# Patient Record
Sex: Male | Born: 1955
Health system: Southern US, Community
[De-identification: ages and names within clinical notes are randomized; demographics above are authoritative.]

## PROBLEM LIST (undated history)

## (undated) DIAGNOSIS — E785 Hyperlipidemia, unspecified: Secondary | ICD-10-CM

## (undated) DIAGNOSIS — IMO0001 Reserved for inherently not codable concepts without codable children: Secondary | ICD-10-CM

## (undated) DIAGNOSIS — I10 Essential (primary) hypertension: Secondary | ICD-10-CM

## (undated) DIAGNOSIS — E119 Type 2 diabetes mellitus without complications: Secondary | ICD-10-CM

## (undated) DIAGNOSIS — K219 Gastro-esophageal reflux disease without esophagitis: Secondary | ICD-10-CM

## (undated) HISTORY — DX: Reserved for inherently not codable concepts without codable children: IMO0001

## (undated) HISTORY — PX: COLONOSCOPY: SHX174

## (undated) HISTORY — DX: Essential (primary) hypertension: I10

## (undated) HISTORY — DX: Type 2 diabetes mellitus without complications: E11.9

## (undated) HISTORY — DX: Hyperlipidemia, unspecified: E78.5

## (undated) HISTORY — DX: Gastro-esophageal reflux disease without esophagitis: K21.9

## (undated) HISTORY — PX: EYE SURGERY: SHX253

---

## 1999-04-24 ENCOUNTER — Ambulatory Visit (HOSPITAL_COMMUNITY): Admission: RE | Admit: 1999-04-24 | Discharge: 1999-04-24 | Payer: Self-pay | Admitting: Orthopedic Surgery

## 2005-06-01 ENCOUNTER — Ambulatory Visit (HOSPITAL_COMMUNITY): Admission: RE | Admit: 2005-06-01 | Discharge: 2005-06-01 | Payer: Self-pay | Admitting: Family Medicine

## 2006-06-05 ENCOUNTER — Emergency Department (HOSPITAL_COMMUNITY): Admission: EM | Admit: 2006-06-05 | Discharge: 2006-06-05 | Payer: Self-pay | Admitting: Emergency Medicine

## 2008-12-11 ENCOUNTER — Ambulatory Visit (HOSPITAL_COMMUNITY): Admission: RE | Admit: 2008-12-11 | Discharge: 2008-12-11 | Payer: Self-pay | Admitting: Family Medicine

## 2009-06-04 ENCOUNTER — Encounter: Payer: Self-pay | Admitting: Gastroenterology

## 2009-06-30 ENCOUNTER — Ambulatory Visit (HOSPITAL_COMMUNITY): Admission: RE | Admit: 2009-06-30 | Discharge: 2009-06-30 | Payer: Self-pay | Admitting: Gastroenterology

## 2009-06-30 ENCOUNTER — Ambulatory Visit: Payer: Self-pay | Admitting: Gastroenterology

## 2010-06-16 NOTE — Letter (Signed)
Summary: Internal Other/triage  Internal Other/triage   Imported By: Cloria Spring LPN 16/02/9603 54:09:81  _____________________________________________________________________  External Attachment:    Type:   Image     Comment:   External Document

## 2010-08-05 LAB — GLUCOSE, CAPILLARY: Glucose-Capillary: 110 mg/dL — ABNORMAL HIGH (ref 70–99)

## 2010-09-29 NOTE — Procedures (Signed)
Tristan Mccarty, Tristan Mccarty                ACCOUNT NO.:  [id number]   MEDICAL RECORD NO.:  [id number]          PATIENT TYPE:  OUT   LOCATION:  RAD                           FACILITY:  [facility]   PHYSICIAN:  Donna Bernard, M.D.DATE OF BIRTH:  02/17/1956   DATE OF PROCEDURE:  12/11/2008  DATE OF DISCHARGE:                                  STRESS TEST   INDICATION FOR TEST:  The patient is a 55 year old male with a strong  family history of premature coronary artery disease who is looking to  get back to regular exercise program.   Stress test was performed at standard Bruce protocol.  Resting EKG  revealed normal sinus rhythm with no significant ST-T changes.  He had  normal blood pressure, 130/80.  The patient tolerated the first 2 stages  very well.  By the third stage, he is getting somewhat short of breath.  He had no chest pain.  He had normal hypertensive response to exercise  as expected.  The patient made it all way in to the full stage 30  seconds in.  Before the test stopped on the basis of fatigue and  shortness of breath, the patient reached greater than 90%, his maximum  predicted heart rate.  His peak heart rate was 161, had maximum rate of  0.08 seconds past the J-point.  There were no significant ST-T changes  noted.   IMPRESSION:  Negative adequate stress test.   PLAN:  The patient encouraged to press on with regular exercise program.      W. Simone Curia, M.D.  Electronically Signed     WSL/MEDQ  D:  12/11/2008  T:  12/11/2008  Job:  [id number]

## 2010-10-02 NOTE — Procedures (Signed)
Tristan Mccarty, Tristan Mccarty NO.:  [id number]   MEDICAL RECORD NO.:  [id number]          PATIENT TYPE:  OUT   LOCATION:  [facility]                          FACILITY:  [facility]   PHYSICIAN:  Donna Bernard, M.D.DATE OF BIRTH:  [birth year]   DATE OF PROCEDURE:  06/01/2005  DATE OF DISCHARGE:                                    STRESS TEST   INDICATIONS:  This patient is a 55 year old white male with history of  hyperlipidemia and positive history of premature coronary artery disease in  his father along with atypical chest pain.   Stress test was performed at the standard Bruce protocol.  Resting EKG  revealed normal sinus rhythm.  No significant ST-T changes.  The patient did  have initial blood pressure 150/90.  The patient tolerated the first two  stages well.  During the third stage he experienced progressive shortness of  breath along with tachycardia.  He had no chest pain at all.  The test was  stopped at 8 minutes into the test and 2 minutes into the third stage.  At  this point, at point 0.08 seconds past the J point, there was minimal  depression in the ST segments with the sharp wave ascending slope.  At  cessation test, segments resolved quickly and nicely.   IMPRESSION:  Negative adequate stress test.   PLAN:  Regular exercise program encouraged.   DIAGNOSIS:  Working diagnosis is the chest pain is due to musculoskeletal  pain.      Donna Bernard, M.D.  Electronically Signed     WSL/MEDQ  D:  06/01/2005  T:  06/01/2005  Job:  [id number]

## 2012-10-21 ENCOUNTER — Other Ambulatory Visit: Payer: Self-pay | Admitting: Family Medicine

## 2012-10-23 ENCOUNTER — Other Ambulatory Visit: Payer: Self-pay | Admitting: Family Medicine

## 2012-10-24 ENCOUNTER — Encounter: Payer: Self-pay | Admitting: *Deleted

## 2012-11-21 ENCOUNTER — Other Ambulatory Visit: Payer: Self-pay | Admitting: Family Medicine

## 2012-11-22 ENCOUNTER — Other Ambulatory Visit: Payer: Self-pay | Admitting: Family Medicine

## 2012-12-18 ENCOUNTER — Other Ambulatory Visit: Payer: Self-pay | Admitting: Family Medicine

## 2013-01-10 ENCOUNTER — Other Ambulatory Visit: Payer: Self-pay | Admitting: Family Medicine

## 2013-03-16 ENCOUNTER — Other Ambulatory Visit: Payer: Self-pay | Admitting: Family Medicine

## 2013-04-08 ENCOUNTER — Other Ambulatory Visit: Payer: Self-pay | Admitting: Family Medicine

## 2013-04-09 ENCOUNTER — Other Ambulatory Visit: Payer: Self-pay | Admitting: Family Medicine

## 2013-04-11 ENCOUNTER — Other Ambulatory Visit: Payer: Self-pay | Admitting: Family Medicine

## 2013-04-23 ENCOUNTER — Other Ambulatory Visit: Payer: Self-pay | Admitting: Family Medicine

## 2013-05-07 ENCOUNTER — Encounter: Payer: Self-pay | Admitting: Family Medicine

## 2013-05-07 ENCOUNTER — Ambulatory Visit (INDEPENDENT_AMBULATORY_CARE_PROVIDER_SITE_OTHER): Payer: BC Managed Care – PPO | Admitting: Family Medicine

## 2013-05-07 VITALS — BP 148/96 | Ht 66.0 in | Wt 212.0 lb

## 2013-05-07 DIAGNOSIS — E119 Type 2 diabetes mellitus without complications: Secondary | ICD-10-CM

## 2013-05-07 DIAGNOSIS — Z79899 Other long term (current) drug therapy: Secondary | ICD-10-CM

## 2013-05-07 DIAGNOSIS — E785 Hyperlipidemia, unspecified: Secondary | ICD-10-CM

## 2013-05-07 DIAGNOSIS — I10 Essential (primary) hypertension: Secondary | ICD-10-CM

## 2013-05-07 LAB — POCT GLYCOSYLATED HEMOGLOBIN (HGB A1C): Hemoglobin A1C: 7.7

## 2013-05-07 MED ORDER — LOSARTAN POTASSIUM 50 MG PO TABS: ORAL_TABLET | ORAL | Status: DC

## 2013-05-07 MED ORDER — METFORMIN HCL 1000 MG PO TABS: 1000.0000 mg | ORAL_TABLET | Freq: Two times a day (BID) | ORAL | Status: DC

## 2013-05-07 MED ORDER — SIMVASTATIN 40 MG PO TABS: ORAL_TABLET | ORAL | Status: DC

## 2013-05-07 MED ORDER — PANTOPRAZOLE SODIUM 40 MG PO TBEC: 40.0000 mg | DELAYED_RELEASE_TABLET | Freq: Every day | ORAL | Status: DC

## 2013-05-07 NOTE — Progress Notes (Signed)
   Subjective:    Patient ID: Tristan Mccarty, male    DOB: [birth year], 57 y.o.   MRN: [id number]  HPI Here for a check up on meds. A1C today 7.7. Morn sugars elevated averages in the 140s. Ck each morning  Not exercising a great deal. Rare, does nt use t mill  Diet stinks, has fell off  Results for orders placed in visit on 05/07/13  POCT GLYCOSYLATED HEMOGLOBIN (HGB A1C)      Result Value Range   Hemoglobin A1C 7.7     htn meds take faithfully, rarely misses. watcheds salt intake  tykes lipid meds regularly. No obvious side effects from this. Tries to watch amount of fat in his diet. Generally does not check blood pressure elsewhere.  Patient reports reflux is stable as long as she sticks with medication.  occas bouts of diarrhea couple times per month  Saw an eye doc, visit due soon,  No concerns.     Review of Systems No headache no chest pain no abdominal pain no change in bowel habits no blood in stool ROS otherwise negative    Objective:   Physical Exam Alert no apparent distress vitals review. HEENT normal. Lungs clear. Heart regular in rhythm. C. foot exam.       Assessment & Plan:  Impression 1 hypertension decent control. #2 hyperlipidemia status uncertain. #Type 2 diabetes suboptimum control discussed with patient. Patient admits diet has not been good. Plan patient to work hard on exercise. Try to lose weight. Time of his diet. Remains compliant with medications. If A1c not improved on next visit we'll need to initiate additional medication. WSL

## 2013-05-13 DIAGNOSIS — I10 Essential (primary) hypertension: Secondary | ICD-10-CM | POA: Insufficient documentation

## 2013-05-13 DIAGNOSIS — E785 Hyperlipidemia, unspecified: Secondary | ICD-10-CM | POA: Insufficient documentation

## 2013-05-13 DIAGNOSIS — E119 Type 2 diabetes mellitus without complications: Secondary | ICD-10-CM | POA: Insufficient documentation

## 2013-05-14 ENCOUNTER — Other Ambulatory Visit: Payer: Self-pay | Admitting: Family Medicine

## 2013-05-15 LAB — HEPATIC FUNCTION PANEL
ALT: 27 U/L (ref 0–53)
Alkaline Phosphatase: 69 U/L (ref 39–117)
Bilirubin, Direct: 0.1 mg/dL (ref 0.0–0.3)
Indirect Bilirubin: 0.3 mg/dL (ref 0.0–0.9)
Total Protein: 6.8 g/dL (ref 6.0–8.3)

## 2013-05-15 LAB — LIPID PANEL
Cholesterol: 130 mg/dL (ref 0–200)
Triglycerides: 153 mg/dL — ABNORMAL HIGH (ref <150)

## 2013-05-28 ENCOUNTER — Encounter: Payer: Self-pay | Admitting: Family Medicine

## 2013-06-01 ENCOUNTER — Other Ambulatory Visit: Payer: Self-pay | Admitting: Family Medicine

## 2013-07-10 ENCOUNTER — Encounter: Payer: BC Managed Care – PPO | Admitting: Family Medicine

## 2013-07-26 ENCOUNTER — Encounter: Payer: BC Managed Care – PPO | Admitting: Family Medicine

## 2013-10-29 ENCOUNTER — Other Ambulatory Visit: Payer: Self-pay | Admitting: Family Medicine

## 2013-11-06 ENCOUNTER — Telehealth: Payer: Self-pay | Admitting: Family Medicine

## 2013-11-06 NOTE — Telephone Encounter (Signed)
Pt needs to have his Pharmacy or insurance fax [place] the  Request for a different set of strips an meter that his insurance Is requiring him to use. He states he will have them send it over Even though they have told him they have already done so on numerous  Occasions.

## 2013-11-06 NOTE — Telephone Encounter (Signed)
OK 

## 2013-11-27 ENCOUNTER — Other Ambulatory Visit: Payer: Self-pay | Admitting: Family Medicine

## 2013-11-28 ENCOUNTER — Other Ambulatory Visit: Payer: Self-pay | Admitting: Family Medicine

## 2013-12-12 ENCOUNTER — Encounter: Payer: Self-pay | Admitting: Family Medicine

## 2013-12-12 ENCOUNTER — Ambulatory Visit (INDEPENDENT_AMBULATORY_CARE_PROVIDER_SITE_OTHER): Payer: BC Managed Care – PPO | Admitting: Family Medicine

## 2013-12-12 VITALS — BP 130/86 | Ht 66.0 in | Wt 208.6 lb

## 2013-12-12 DIAGNOSIS — E119 Type 2 diabetes mellitus without complications: Secondary | ICD-10-CM

## 2013-12-12 DIAGNOSIS — Z79899 Other long term (current) drug therapy: Secondary | ICD-10-CM

## 2013-12-12 DIAGNOSIS — E782 Mixed hyperlipidemia: Secondary | ICD-10-CM

## 2013-12-12 DIAGNOSIS — Z125 Encounter for screening for malignant neoplasm of prostate: Secondary | ICD-10-CM

## 2013-12-12 DIAGNOSIS — Z0000 Encounter for general adult medical examination without abnormal findings: Secondary | ICD-10-CM

## 2013-12-12 LAB — POCT GLYCOSYLATED HEMOGLOBIN (HGB A1C): HEMOGLOBIN A1C: 7.4

## 2013-12-12 MED ORDER — LOSARTAN POTASSIUM 50 MG PO TABS: ORAL_TABLET | ORAL | Status: DC

## 2013-12-12 MED ORDER — METFORMIN HCL 1000 MG PO TABS: ORAL_TABLET | ORAL | Status: DC

## 2013-12-12 MED ORDER — PANTOPRAZOLE SODIUM 40 MG PO TBEC: 40.0000 mg | DELAYED_RELEASE_TABLET | Freq: Every day | ORAL | Status: DC

## 2013-12-12 MED ORDER — SIMVASTATIN 40 MG PO TABS: ORAL_TABLET | ORAL | Status: DC

## 2013-12-12 MED ORDER — GLIPIZIDE 5 MG PO TABS: 5.0000 mg | ORAL_TABLET | Freq: Every day | ORAL | Status: DC

## 2013-12-12 NOTE — Progress Notes (Signed)
Subjective:    Patient ID: Tristan Mccarty, male    DOB: December 16, 1955, 58 y.o.   MRN: [id number]  HPI  The patient comes in today for a wellness visit.  Neg colon ca and no prost  a  A review of their health history was completed.  A review of medications was also completed.  Any needed refills; yes  Eating habits: eating pretty good  Falls/  MVA accidents in past few months: no  Regular exercise: yard work  Sales promotion account executive pt sees on regular basis: no  Preventative health issues were discussed.   Additional concerns: sugar numbers climbing  Results for orders placed in visit on 05/07/13  LIPID PANEL      Result Value Ref Range   Cholesterol 130  0 - 200 mg/dL   Triglycerides 153 (*) <150 mg/dL   HDL 41  >39 mg/dL   Total CHOL/HDL Ratio 3.2     VLDL 31  0 - 40 mg/dL   LDL Cholesterol 58  0 - 99 mg/dL  HEPATIC FUNCTION PANEL      Result Value Ref Range   Total Bilirubin 0.4  0.3 - 1.2 mg/dL   Bilirubin, Direct 0.1  0.0 - 0.3 mg/dL   Indirect Bilirubin 0.3  0.0 - 0.9 mg/dL   Alkaline Phosphatase 69  39 - 117 U/L   AST 18  0 - 37 U/L   ALT 27  0 - 53 U/L   Total Protein 6.8  6.0 - 8.3 g/dL   Albumin 4.2  3.5 - 5.2 g/dL  MICROALBUMIN, URINE      Result Value Ref Range   Microalb, Ur 0.60  0.00 - 1.89 mg/dL  POCT GLYCOSYLATED HEMOGLOBIN (HGB A1C)      Result Value Ref Range   Hemoglobin A1C 7.7     Works 12 hr days, and uses otc antihist to help sleep  Last eye doc visit two yrs ago, to sched soon  Results for orders placed in visit on 05/07/13  LIPID PANEL      Result Value Ref Range   Cholesterol 130  0 - 200 mg/dL   Triglycerides 153 (*) <150 mg/dL   HDL 41  >39 mg/dL   Total CHOL/HDL Ratio 3.2     VLDL 31  0 - 40 mg/dL   LDL Cholesterol 58  0 - 99 mg/dL  HEPATIC FUNCTION PANEL      Result Value Ref Range   Total Bilirubin 0.4  0.3 - 1.2 mg/dL   Bilirubin, Direct 0.1  0.0 - 0.3 mg/dL   Indirect Bilirubin 0.3  0.0 - 0.9 mg/dL   Alkaline Phosphatase 69  39  - 117 U/L   AST 18  0 - 37 U/L   ALT 27  0 - 53 U/L   Total Protein 6.8  6.0 - 8.3 g/dL   Albumin 4.2  3.5 - 5.2 g/dL  MICROALBUMIN, URINE      Result Value Ref Range   Microalb, Ur 0.60  0.00 - 1.89 mg/dL  POCT GLYCOSYLATED HEMOGLOBIN (HGB A1C)      Result Value Ref Range   Hemoglobin A1C 7.7      Review of Systems  Constitutional: Negative for fever, activity change and appetite change.  HENT: Negative for congestion and rhinorrhea.   Eyes: Negative for discharge.  Respiratory: Negative for cough and wheezing.   Cardiovascular: Negative for chest pain.  Gastrointestinal: Negative for vomiting, abdominal pain and blood in stool.  Genitourinary: Negative for  frequency and difficulty urinating.  Musculoskeletal: Negative for neck pain.  Skin: Negative for rash.  Allergic/Immunologic: Negative for environmental allergies and food allergies.  Neurological: Negative for weakness and headaches.  Psychiatric/Behavioral: Negative for agitation.  All other systems reviewed and are negative.      Objective:   Physical Exam  Vitals reviewed. Constitutional: He appears well-developed and well-nourished.  HENT:  Head: Normocephalic and atraumatic.  Right Ear: External ear normal.  Left Ear: External ear normal.  Nose: Nose normal.  Mouth/Throat: Oropharynx is clear and moist.  Eyes: EOM are normal. Pupils are equal, round, and reactive to light.  Neck: Normal range of motion. Neck supple. No thyromegaly present.  Cardiovascular: Normal rate, regular rhythm and normal heart sounds.   No murmur heard. Pulmonary/Chest: Effort normal and breath sounds normal. No respiratory distress. He has no wheezes.  Abdominal: Soft. Bowel sounds are normal. He exhibits no distension and no mass. There is no tenderness.  Genitourinary: Penis normal.  Musculoskeletal: Normal range of motion. He exhibits no edema.  Lymphadenopathy:    He has no cervical adenopathy.  Neurological: He is alert. He  exhibits normal muscle tone.  Skin: Skin is warm and dry. No erythema.  Psychiatric: He has a normal mood and affect. His behavior is normal. Judgment normal.          Assessment & Plan:   #1 wellness exam. Diet discussed exercise discussed. Anticipatory guidance given. Vaccines discussed. #2 type 2 diabetes. Control worsening long discussion held. Patient willing to add new medication. Plan encouraged as noted above plus yearly eye visits. Add Glucotrol. Rationale discussed. Followup as scheduled. WSL

## 2013-12-17 ENCOUNTER — Other Ambulatory Visit: Payer: Self-pay | Admitting: Family Medicine

## 2014-01-04 ENCOUNTER — Other Ambulatory Visit: Payer: Self-pay | Admitting: Family Medicine

## 2014-01-31 ENCOUNTER — Other Ambulatory Visit: Payer: Self-pay | Admitting: Family Medicine

## 2014-02-05 ENCOUNTER — Other Ambulatory Visit: Payer: Self-pay | Admitting: *Deleted

## 2014-02-05 MED ORDER — GLIPIZIDE 5 MG PO TABS: 5.0000 mg | ORAL_TABLET | Freq: Every day | ORAL | Status: DC

## 2014-02-07 ENCOUNTER — Other Ambulatory Visit: Payer: Self-pay | Admitting: *Deleted

## 2014-02-07 MED ORDER — PANTOPRAZOLE SODIUM 40 MG PO TBEC: 40.0000 mg | DELAYED_RELEASE_TABLET | Freq: Every day | ORAL | Status: DC

## 2014-02-07 MED ORDER — LOSARTAN POTASSIUM 50 MG PO TABS: ORAL_TABLET | ORAL | Status: DC

## 2014-02-11 LAB — HEPATIC FUNCTION PANEL
ALK PHOS: 58 U/L (ref 39–117)
ALT: 26 U/L (ref 0–53)
AST: 19 U/L (ref 0–37)
Albumin: 4.5 g/dL (ref 3.5–5.2)
BILIRUBIN INDIRECT: 0.4 mg/dL (ref 0.2–1.2)
Bilirubin, Direct: 0.1 mg/dL (ref 0.0–0.3)
TOTAL PROTEIN: 6.9 g/dL (ref 6.0–8.3)
Total Bilirubin: 0.5 mg/dL (ref 0.2–1.2)

## 2014-02-11 LAB — BASIC METABOLIC PANEL
BUN: 11 mg/dL (ref 6–23)
CHLORIDE: 101 meq/L (ref 96–112)
CO2: 27 mEq/L (ref 19–32)
CREATININE: 0.84 mg/dL (ref 0.50–1.35)
Calcium: 9.5 mg/dL (ref 8.4–10.5)
Glucose, Bld: 177 mg/dL — ABNORMAL HIGH (ref 70–99)
Potassium: 4.5 mEq/L (ref 3.5–5.3)
Sodium: 138 mEq/L (ref 135–145)

## 2014-02-11 LAB — LIPID PANEL
CHOLESTEROL: 160 mg/dL (ref 0–200)
HDL: 47 mg/dL (ref >39)
LDL CALC: 76 mg/dL (ref 0–99)
TRIGLYCERIDES: 185 mg/dL — AB (ref <150)
Total CHOL/HDL Ratio: 3.4 Ratio
VLDL: 37 mg/dL (ref 0–40)

## 2014-02-12 LAB — PSA: PSA: 1.54 ng/mL (ref <=4.00)

## 2014-02-17 ENCOUNTER — Encounter: Payer: Self-pay | Admitting: Family Medicine

## 2014-06-18 ENCOUNTER — Encounter: Payer: Self-pay | Admitting: Family Medicine

## 2014-06-18 ENCOUNTER — Ambulatory Visit (INDEPENDENT_AMBULATORY_CARE_PROVIDER_SITE_OTHER): Payer: BLUE CROSS/BLUE SHIELD | Admitting: Family Medicine

## 2014-06-18 VITALS — BP 152/90 | Ht 66.0 in | Wt 213.0 lb

## 2014-06-18 DIAGNOSIS — Z79899 Other long term (current) drug therapy: Secondary | ICD-10-CM

## 2014-06-18 DIAGNOSIS — K645 Perianal venous thrombosis: Secondary | ICD-10-CM

## 2014-06-18 DIAGNOSIS — E119 Type 2 diabetes mellitus without complications: Secondary | ICD-10-CM

## 2014-06-18 DIAGNOSIS — E785 Hyperlipidemia, unspecified: Secondary | ICD-10-CM

## 2014-06-18 NOTE — Progress Notes (Signed)
   Subjective:    Patient ID: Tristan Mccarty, male    DOB: 1955/09/25, 59 y.o.   MRN: [id number]  HPI Hemorrhoids. Problems started last Thursday. Using OTC meds. Started bleeding yesterday.   This has flared up and worse, Pt applying prep h,  No recent constipation  Last colonoscpy four or five yrs ago  No pain with bm's until flare   A lot of bleeding yesterday  Very painful with sitting     Review of Systems No abdominal pain no headache no chest pain no fever    Objective:   Physical Exam Alert mild malaise. Vital stable lungs clear heart rare rhythm perirectal region impressive swollen thrombosed hemorrhoid next  Patient was prepped draped anesthetized in size large clot removed.       Assessment & Plan:  Impression thrombosed hemorrhoid plan when management discussed. Multiple questions answered. WSL

## 2014-06-30 LAB — HEPATIC FUNCTION PANEL
ALK PHOS: 62 U/L (ref 39–117)
ALT: 23 U/L (ref 0–53)
AST: 17 U/L (ref 0–37)
Albumin: 4 g/dL (ref 3.5–5.2)
BILIRUBIN DIRECT: 0.1 mg/dL (ref 0.0–0.3)
BILIRUBIN INDIRECT: 0.2 mg/dL (ref 0.2–1.2)
Total Bilirubin: 0.3 mg/dL (ref 0.2–1.2)
Total Protein: 6.8 g/dL (ref 6.0–8.3)

## 2014-06-30 LAB — LIPID PANEL
Cholesterol: 154 mg/dL (ref 0–200)
HDL: 39 mg/dL — ABNORMAL LOW (ref >39)
LDL CALC: 64 mg/dL (ref 0–99)
Total CHOL/HDL Ratio: 3.9 Ratio
Triglycerides: 254 mg/dL — ABNORMAL HIGH (ref <150)
VLDL: 51 mg/dL — ABNORMAL HIGH (ref 0–40)

## 2014-06-30 LAB — HEMOGLOBIN A1C
HEMOGLOBIN A1C: 8.7 % — AB (ref <5.7)
Mean Plasma Glucose: 203 mg/dL — ABNORMAL HIGH (ref <117)

## 2014-06-30 LAB — MICROALBUMIN, URINE: Microalb, Ur: 0.5 mg/dL (ref <2.0)

## 2014-07-02 ENCOUNTER — Other Ambulatory Visit: Payer: Self-pay | Admitting: Family Medicine

## 2014-07-10 ENCOUNTER — Other Ambulatory Visit: Payer: Self-pay | Admitting: Family Medicine

## 2014-07-22 ENCOUNTER — Ambulatory Visit: Payer: BLUE CROSS/BLUE SHIELD | Admitting: Family Medicine

## 2014-08-02 ENCOUNTER — Other Ambulatory Visit: Payer: Self-pay | Admitting: Family Medicine

## 2014-08-05 ENCOUNTER — Other Ambulatory Visit: Payer: Self-pay | Admitting: Family Medicine

## 2014-08-12 ENCOUNTER — Other Ambulatory Visit: Payer: Self-pay | Admitting: Family Medicine

## 2014-08-14 ENCOUNTER — Ambulatory Visit: Payer: BLUE CROSS/BLUE SHIELD | Admitting: Family Medicine

## 2014-08-15 ENCOUNTER — Other Ambulatory Visit: Payer: Self-pay | Admitting: Family Medicine

## 2014-08-20 ENCOUNTER — Other Ambulatory Visit: Payer: Self-pay | Admitting: Family Medicine

## 2014-08-21 ENCOUNTER — Other Ambulatory Visit: Payer: Self-pay | Admitting: Family Medicine

## 2014-08-28 ENCOUNTER — Other Ambulatory Visit: Payer: Self-pay | Admitting: *Deleted

## 2014-08-28 MED ORDER — SIMVASTATIN 40 MG PO TABS: 40.0000 mg | ORAL_TABLET | Freq: Every day | ORAL | Status: DC

## 2014-09-04 ENCOUNTER — Other Ambulatory Visit: Payer: Self-pay | Admitting: *Deleted

## 2014-09-04 MED ORDER — METFORMIN HCL 1000 MG PO TABS: 1000.0000 mg | ORAL_TABLET | Freq: Two times a day (BID) | ORAL | Status: DC

## 2014-09-07 ENCOUNTER — Other Ambulatory Visit: Payer: Self-pay | Admitting: Family Medicine

## 2014-09-09 ENCOUNTER — Ambulatory Visit (INDEPENDENT_AMBULATORY_CARE_PROVIDER_SITE_OTHER): Payer: BLUE CROSS/BLUE SHIELD | Admitting: Family Medicine

## 2014-09-09 ENCOUNTER — Encounter: Payer: Self-pay | Admitting: Family Medicine

## 2014-09-09 VITALS — BP 140/88 | Ht 66.0 in | Wt 210.8 lb

## 2014-09-09 DIAGNOSIS — I10 Essential (primary) hypertension: Secondary | ICD-10-CM

## 2014-09-09 DIAGNOSIS — E785 Hyperlipidemia, unspecified: Secondary | ICD-10-CM | POA: Diagnosis not present

## 2014-09-09 DIAGNOSIS — E119 Type 2 diabetes mellitus without complications: Secondary | ICD-10-CM

## 2014-09-09 MED ORDER — METFORMIN HCL 1000 MG PO TABS: 1000.0000 mg | ORAL_TABLET | Freq: Two times a day (BID) | ORAL | Status: DC

## 2014-09-09 MED ORDER — LOSARTAN POTASSIUM 50 MG PO TABS: 50.0000 mg | ORAL_TABLET | Freq: Every day | ORAL | Status: DC

## 2014-09-09 MED ORDER — GLIPIZIDE 5 MG PO TABS: 5.0000 mg | ORAL_TABLET | Freq: Two times a day (BID) | ORAL | Status: DC

## 2014-09-09 MED ORDER — PANTOPRAZOLE SODIUM 40 MG PO TBEC: 40.0000 mg | DELAYED_RELEASE_TABLET | Freq: Every day | ORAL | Status: DC

## 2014-09-09 MED ORDER — SIMVASTATIN 40 MG PO TABS: 40.0000 mg | ORAL_TABLET | Freq: Every day | ORAL | Status: DC

## 2014-09-09 NOTE — Progress Notes (Signed)
   Subjective:    Patient ID: Tristan Mccarty, male    DOB: 1955-11-22, 59 y.o.   MRN: [id number]  Diabetes He presents for his follow-up diabetic visit. He has type 2 diabetes mellitus. Risk factors for coronary artery disease include dyslipidemia, diabetes mellitus and hypertension. Current diabetic treatment includes oral agent (dual therapy). He is compliant with treatment all of the time. His weight is stable. He is following a diabetic diet. He has not had a previous visit with a dietitian. He does not see a podiatrist.Eye exam is current.   Discuss recent lab results from Feb.  Results for orders placed or performed in visit on 06/18/14  Lipid panel  Result Value Ref Range   Cholesterol 154 0 - 200 mg/dL   Triglycerides 254 (H) <150 mg/dL   HDL 39 (L) >39 mg/dL   Total CHOL/HDL Ratio 3.9 Ratio   VLDL 51 (H) 0 - 40 mg/dL   LDL Cholesterol 64 0 - 99 mg/dL  Hepatic function panel  Result Value Ref Range   Total Bilirubin 0.3 0.2 - 1.2 mg/dL   Bilirubin, Direct 0.1 0.0 - 0.3 mg/dL   Indirect Bilirubin 0.2 0.2 - 1.2 mg/dL   Alkaline Phosphatase 62 39 - 117 U/L   AST 17 0 - 37 U/L   ALT 23 0 - 53 U/L   Total Protein 6.8 6.0 - 8.3 g/dL   Albumin 4.0 3.5 - 5.2 g/dL  Hemoglobin A1c  Result Value Ref Range   Hgb A1c MFr Bld 8.7 (H) <5.7 %   Mean Plasma Glucose 203 (H) <117 mg/dL  Microalbumin, urine  Result Value Ref Range   Microalb, Ur 0.5 <2.0 mg/dL   200,  160s some days   Not much exercise other than walking,  Sticks with meds faithfully  eatds salad once or so per day,  Compliant with blood pressure medication. Has cut salt intake down. Does not miss a dose. Numbers generally good when checked elsewhere.  Compliant with lipid medicine. No obvious side effects. Working on fat intake.  Unfortunately not exercising.  Compliant with reflux medicine. States it definitely helps and that he needs it   Review of Systems    no headache no chest pain no back pain no  change in bowel habits no blood in stool ROS otherwise negative Objective:   Physical Exam  Alert vitals stable. HEENT normal. Lungs clear. Heart regular in rhythm. Ankles without edema.      Assessment & Plan:  Impression type 2 diabetes control suboptimal discussed #2 hypertension good control #3 hyperlipidemia LDL decent but now triglycerides higher discussed #4 reflux plan increase glipizide 2 3 times a day. Recheck in several months for wellness exam. Maintain other medications. Diet exercise discussed WSL

## 2014-09-10 ENCOUNTER — Other Ambulatory Visit: Payer: Self-pay | Admitting: Family Medicine

## 2014-09-26 ENCOUNTER — Other Ambulatory Visit: Payer: Self-pay | Admitting: Family Medicine

## 2014-11-11 LAB — HM DIABETES EYE EXAM

## 2014-12-09 ENCOUNTER — Other Ambulatory Visit: Payer: Self-pay | Admitting: Family Medicine

## 2014-12-11 ENCOUNTER — Encounter: Payer: Self-pay | Admitting: Family Medicine

## 2014-12-11 ENCOUNTER — Ambulatory Visit (INDEPENDENT_AMBULATORY_CARE_PROVIDER_SITE_OTHER): Payer: BLUE CROSS/BLUE SHIELD | Admitting: Family Medicine

## 2014-12-11 VITALS — BP 138/86 | Ht 66.0 in | Wt 213.8 lb

## 2014-12-11 DIAGNOSIS — Z125 Encounter for screening for malignant neoplasm of prostate: Secondary | ICD-10-CM | POA: Diagnosis not present

## 2014-12-11 DIAGNOSIS — E785 Hyperlipidemia, unspecified: Secondary | ICD-10-CM | POA: Diagnosis not present

## 2014-12-11 DIAGNOSIS — I10 Essential (primary) hypertension: Secondary | ICD-10-CM | POA: Diagnosis not present

## 2014-12-11 DIAGNOSIS — Z79899 Other long term (current) drug therapy: Secondary | ICD-10-CM | POA: Diagnosis not present

## 2014-12-11 DIAGNOSIS — E119 Type 2 diabetes mellitus without complications: Secondary | ICD-10-CM | POA: Diagnosis not present

## 2014-12-11 LAB — POCT GLYCOSYLATED HEMOGLOBIN (HGB A1C): HEMOGLOBIN A1C: 7.8

## 2014-12-11 MED ORDER — GLIPIZIDE 5 MG PO TABS: ORAL_TABLET | ORAL | Status: DC

## 2014-12-11 NOTE — Progress Notes (Signed)
   Subjective:    Patient ID: Tristan Mccarty, male    DOB: 1955-06-25, 59 y.o.   MRN: [id number]  Diabetes He presents for his initial diabetic visit. He has type 2 diabetes mellitus. Risk factors for coronary artery disease include diabetes mellitus, dyslipidemia and hypertension. Current diabetic treatment includes oral agent (monotherapy). He is compliant with treatment all of the time. His weight is stable. He is following a diabetic diet. He has not had a previous visit with a dietitian. He does not see a podiatrist.Eye exam is current.    Results for orders placed or performed in visit on 12/11/14  POCT glycosylated hemoglobin (Hb A1C)  Result Value Ref Range   Hemoglobin A1C 7.8    No low sugar spells  Diet overal good, not much of a difference  Traveling a fair amnt, hard time eating the right things  Patient compliant with blood pressure medicine. No obvious side effects. Blood pressure generally good when checked elsewhere. Has cut salt down.  Reflux is generally stable 1 sticking with medication.  Patient claims compliance with cholesterol medicine may be misses one out of 2 weeks dose. Otherwise handles well.    Review of Systems No headache no chest pain no back pain abdominal pain no change in bowel habits no blood in stool    Objective:   Physical Exam Alert vitals stable. H&T normal. Lungs clear. Heart regular in rhythm. Ankles without edema.       Assessment & Plan:  Impression #1 type 2 diabetes control good but not ideal discussed improved. #2 hypertension good control #3 hyperlipidemia status uncertain plan [place] exam scheduled. Diet exercise discussed. Increase glipizide to 2 3 times a day. Concerns discussed. WSL

## 2014-12-23 ENCOUNTER — Encounter: Payer: Self-pay | Admitting: Family Medicine

## 2014-12-23 LAB — BASIC METABOLIC PANEL
BUN/Creatinine Ratio: 14 (ref 9–20)
BUN: 12 mg/dL (ref 6–24)
CALCIUM: 10 mg/dL (ref 8.7–10.2)
CO2: 23 mmol/L (ref 18–29)
Chloride: 97 mmol/L (ref 97–108)
Creatinine, Ser: 0.87 mg/dL (ref 0.76–1.27)
GFR calc Af Amer: 111 mL/min/{1.73_m2} (ref >59)
GFR calc non Af Amer: 96 mL/min/{1.73_m2} (ref >59)
GLUCOSE: 189 mg/dL — AB (ref 65–99)
Potassium: 4.9 mmol/L (ref 3.5–5.2)
Sodium: 138 mmol/L (ref 134–144)

## 2014-12-23 LAB — HEPATIC FUNCTION PANEL
ALBUMIN: 4.7 g/dL (ref 3.5–5.5)
ALK PHOS: 67 IU/L (ref 39–117)
ALT: 28 IU/L (ref 0–44)
AST: 21 IU/L (ref 0–40)
Bilirubin Total: 0.4 mg/dL (ref 0.0–1.2)
Bilirubin, Direct: 0.12 mg/dL (ref 0.00–0.40)
TOTAL PROTEIN: 6.8 g/dL (ref 6.0–8.5)

## 2014-12-23 LAB — LIPID PANEL
CHOL/HDL RATIO: 3.9 ratio (ref 0.0–5.0)
Cholesterol, Total: 157 mg/dL (ref 100–199)
HDL: 40 mg/dL (ref >39)
LDL Calculated: 69 mg/dL (ref 0–99)
Triglycerides: 241 mg/dL — ABNORMAL HIGH (ref 0–149)
VLDL CHOLESTEROL CAL: 48 mg/dL — AB (ref 5–40)

## 2014-12-23 LAB — PSA: PROSTATE SPECIFIC AG, SERUM: 1.6 ng/mL (ref 0.0–4.0)

## 2014-12-23 LAB — MICROALBUMIN, URINE: MICROALBUM., U, RANDOM: 17.6 ug/mL

## 2014-12-31 ENCOUNTER — Other Ambulatory Visit: Payer: Self-pay | Admitting: Family Medicine

## 2015-01-10 ENCOUNTER — Other Ambulatory Visit: Payer: Self-pay | Admitting: Family Medicine

## 2015-03-12 ENCOUNTER — Other Ambulatory Visit: Payer: Self-pay | Admitting: Family Medicine

## 2015-03-17 ENCOUNTER — Ambulatory Visit (INDEPENDENT_AMBULATORY_CARE_PROVIDER_SITE_OTHER): Payer: BLUE CROSS/BLUE SHIELD | Admitting: Family Medicine

## 2015-03-17 ENCOUNTER — Encounter: Payer: Self-pay | Admitting: Family Medicine

## 2015-03-17 VITALS — BP 138/88 | Ht 66.5 in | Wt 209.2 lb

## 2015-03-17 DIAGNOSIS — E119 Type 2 diabetes mellitus without complications: Secondary | ICD-10-CM | POA: Diagnosis not present

## 2015-03-17 DIAGNOSIS — Z0000 Encounter for general adult medical examination without abnormal findings: Secondary | ICD-10-CM

## 2015-03-17 DIAGNOSIS — I10 Essential (primary) hypertension: Secondary | ICD-10-CM

## 2015-03-17 DIAGNOSIS — N5201 Erectile dysfunction due to arterial insufficiency: Secondary | ICD-10-CM | POA: Diagnosis not present

## 2015-03-17 LAB — POCT GLYCOSYLATED HEMOGLOBIN (HGB A1C): Hemoglobin A1C: 7.5

## 2015-03-17 MED ORDER — METFORMIN HCL 1000 MG PO TABS: 1000.0000 mg | ORAL_TABLET | Freq: Two times a day (BID) | ORAL | Status: DC

## 2015-03-17 MED ORDER — LOSARTAN POTASSIUM 50 MG PO TABS: 50.0000 mg | ORAL_TABLET | Freq: Every day | ORAL | Status: DC

## 2015-03-17 MED ORDER — PANTOPRAZOLE SODIUM 40 MG PO TBEC: 40.0000 mg | DELAYED_RELEASE_TABLET | Freq: Every day | ORAL | Status: DC

## 2015-03-17 MED ORDER — SILDENAFIL CITRATE 20 MG PO TABS: ORAL_TABLET | ORAL | Status: DC

## 2015-03-17 MED ORDER — SIMVASTATIN 40 MG PO TABS: 40.0000 mg | ORAL_TABLET | Freq: Every day | ORAL | Status: DC

## 2015-03-17 MED ORDER — GLIPIZIDE 5 MG PO TABS: 10.0000 mg | ORAL_TABLET | Freq: Two times a day (BID) | ORAL | Status: DC

## 2015-03-17 NOTE — Progress Notes (Signed)
   Subjective:    Patient ID: Tristan Mccarty, male    DOB: Oct 26, 1955, 59 y.o.   MRN: [id number]  HPI The patient comes in today for a wellness visit.    A review of their health history was completed.  A review of medications was also complete Any needed refills;  Results for orders placed or performed in visit on 03/17/15  POCT glycosylated hemoglobin (Hb A1C)  Result Value Ref Range   Hemoglobin A1C 7.5    Off and on exdcid tihhy noe   Eating habits: good  Falls/  MVA accidents in past few months: none  Regular exercise: work  Sales promotion account executive pt sees on regular basis: none  Preventative health issues were discussed.   Additional concerns: elevated sugar levels, Glu mixed lately up and down at times  Neg in 2011, next on due   No colon ca no prostate ca  no   Review of Systems  Constitutional: Negative for fever, activity change and appetite change.  HENT: Negative for congestion and rhinorrhea.   Eyes: Negative for discharge.  Respiratory: Negative for cough and wheezing.   Cardiovascular: Negative for chest pain.  Gastrointestinal: Negative for vomiting, abdominal pain and blood in stool.  Genitourinary: Negative for frequency and difficulty urinating.  Musculoskeletal: Negative for neck pain.  Skin: Negative for rash.  Allergic/Immunologic: Negative for environmental allergies and food allergies.  Neurological: Negative for weakness and headaches.  Psychiatric/Behavioral: Negative for agitation.  All other systems reviewed and are negative.      Objective:   Physical Exam  Constitutional: He appears well-developed and well-nourished.  HENT:  Head: Normocephalic and atraumatic.  Right Ear: External ear normal.  Left Ear: External ear normal.  Nose: Nose normal.  Mouth/Throat: Oropharynx is clear and moist.  Eyes: EOM are normal. Pupils are equal, round, and reactive to light.  Neck: Normal range of motion. Neck supple. No thyromegaly present.    Cardiovascular: Normal rate, regular rhythm and normal heart sounds.   No murmur heard. Pulmonary/Chest: Effort normal and breath sounds normal. No respiratory distress. He has no wheezes.  Abdominal: Soft. Bowel sounds are normal. He exhibits no distension and no mass. There is no tenderness.  Genitourinary: Penis normal.  Prostate within normal limits slight diffuse enlargement normal for age  Musculoskeletal: Normal range of motion. He exhibits no edema.  Lymphadenopathy:    He has no cervical adenopathy.  Neurological: He is alert. He exhibits normal muscle tone.  Skin: Skin is warm and dry. No erythema.  Psychiatric: He has a normal mood and affect. His behavior is normal. Judgment normal.  Vitals reviewed.         Assessment & Plan:  Impression 1 wellness exam 2 type two diab disc suboptimum control23 erectile dysfunction disc Plan trial of meds for e d, incr glucotrol diet iex disc f u as sched

## 2015-03-18 DIAGNOSIS — N529 Male erectile dysfunction, unspecified: Secondary | ICD-10-CM | POA: Insufficient documentation

## 2015-04-17 ENCOUNTER — Telehealth: Payer: Self-pay | Admitting: Family Medicine

## 2015-04-17 NOTE — Telephone Encounter (Signed)
The patient needs to understand that Sildenafil  Is not covered by his plan. There is nothing we can do to get that covered. If he tries to run this through his insurance it will be denied. If patient interested in getting it generic he caught to go to a place like Psychiatrist or  Consider getting a prescription to run through [place]. There is a flyer he can look at if he is interested once again do not bother to try to get approval of this medicine , thank you

## 2015-05-02 ENCOUNTER — Encounter: Payer: Self-pay | Admitting: Family Medicine

## 2015-05-02 NOTE — Telephone Encounter (Signed)
Mailed letter and flyer for [employer]

## 2015-06-17 ENCOUNTER — Other Ambulatory Visit: Payer: Self-pay | Admitting: Family Medicine

## 2015-09-11 ENCOUNTER — Telehealth: Payer: Self-pay | Admitting: Family Medicine

## 2015-09-11 DIAGNOSIS — Z139 Encounter for screening, unspecified: Secondary | ICD-10-CM

## 2015-09-11 DIAGNOSIS — E785 Hyperlipidemia, unspecified: Secondary | ICD-10-CM

## 2015-09-11 NOTE — Telephone Encounter (Signed)
Lip liv A1c 

## 2015-09-11 NOTE — Telephone Encounter (Signed)
Tried to call no answer. (voicemail box is full)

## 2015-09-11 NOTE — Telephone Encounter (Signed)
Pt is requesting lab orders to be sent over for an upcoming appt. Last labs per epic were: lipid,hepatic,bmp,psa and microalbumin on 12/21/14

## 2015-09-19 ENCOUNTER — Ambulatory Visit (INDEPENDENT_AMBULATORY_CARE_PROVIDER_SITE_OTHER): Payer: BLUE CROSS/BLUE SHIELD | Admitting: Family Medicine

## 2015-09-19 ENCOUNTER — Encounter: Payer: Self-pay | Admitting: Family Medicine

## 2015-09-19 ENCOUNTER — Telehealth: Payer: Self-pay | Admitting: Family Medicine

## 2015-09-19 VITALS — BP 148/98 | Ht 66.5 in | Wt 212.0 lb

## 2015-09-19 DIAGNOSIS — E785 Hyperlipidemia, unspecified: Secondary | ICD-10-CM | POA: Diagnosis not present

## 2015-09-19 DIAGNOSIS — I10 Essential (primary) hypertension: Secondary | ICD-10-CM | POA: Diagnosis not present

## 2015-09-19 DIAGNOSIS — E119 Type 2 diabetes mellitus without complications: Secondary | ICD-10-CM

## 2015-09-19 LAB — POCT GLYCOSYLATED HEMOGLOBIN (HGB A1C): Hemoglobin A1C: 8.3

## 2015-09-19 MED ORDER — METFORMIN HCL 1000 MG PO TABS: 1000.0000 mg | ORAL_TABLET | Freq: Two times a day (BID) | ORAL | Status: DC

## 2015-09-19 MED ORDER — LOSARTAN POTASSIUM 100 MG PO TABS: 100.0000 mg | ORAL_TABLET | Freq: Every day | ORAL | Status: DC

## 2015-09-19 MED ORDER — GLIPIZIDE 5 MG PO TABS: 10.0000 mg | ORAL_TABLET | Freq: Two times a day (BID) | ORAL | Status: DC

## 2015-09-19 MED ORDER — PANTOPRAZOLE SODIUM 40 MG PO TBEC: 40.0000 mg | DELAYED_RELEASE_TABLET | Freq: Every day | ORAL | Status: DC

## 2015-09-19 MED ORDER — SIMVASTATIN 40 MG PO TABS: 40.0000 mg | ORAL_TABLET | Freq: Every day | ORAL | Status: DC

## 2015-09-19 MED ORDER — CANAGLIFLOZIN 100 MG PO TABS: 100.0000 mg | ORAL_TABLET | ORAL | Status: DC

## 2015-09-19 NOTE — Telephone Encounter (Signed)
canagliflozin (INVOKANA) 100 MG TABS tablet  Pt calling stating his insurance is saying the will not pay for the drug Unless he tries one of these other meds first, Farxiga, Jardiance,  They will pay for one of these meds     [facility]

## 2015-09-19 NOTE — Progress Notes (Signed)
   Subjective:    Patient ID: Tristan Mccarty, male    DOB: March 04, 1956, 60 y.o.   MRN: [id number]  Diabetes He presents for his follow-up diabetic visit. He has type 2 diabetes mellitus. Current diabetic treatment includes oral agent (dual therapy). He is compliant with treatment all of the time. He rarely participates in exercise. His overall blood glucose range is >200 mg/dl. He does not see a podiatrist.Eye exam is current (upcoming appt next week. goes once yearly).   Results for orders placed or performed in visit on 09/19/15  POCT glycosylated hemoglobin (Hb A1C)  Result Value Ref Range   Hemoglobin A1C 8.3    Most morning numbers high 100s and sometime over 200 Pt states no other concerns today besides blood sugar.  Salt not an issue, comp reg  Diet so so   , admits to dietary noncompliance at times   Compliant with blood pressure medication. No obvious side effects. Watching salt intake.  Ongoing challenges with reflux.  Claims compliance with lipid medicine. No obvious side effects. Prior blood work reviewed today with patient.   .  Review of Systems No headache, no major weight loss or weight gain, no chest pain no back pain abdominal pain no change in bowel habits complete ROS otherwise negative     Objective:   Physical Exam   alert vital stable blood pressure in satisfactory repeat. Review the past year shows blood pressures too high for underlying diabetes vitals otherwise stable lungs clear. Heart rare rhythm H&T normal ankles without edema feet pulses sensation good intact      Assessment & Plan:   impression 1 type 2 diabetes suboptimum discuss the need for additional medication or he maxed out on current #2 hypertension suboptimal in with need increase meds rationale discussed #3 some noncompliance with diet exercise discussed #4 hyperlipidemia prior blood work reviewed. Plan increase losartan rationale discussed. Add Tristan Mccarty. Diet exercise discuss other  meds refilled recheck as scheduled. WSL

## 2015-09-22 MED ORDER — DAPAGLIFLOZIN PROPANEDIOL 5 MG PO TABS: 5.0000 mg | ORAL_TABLET | Freq: Every day | ORAL | Status: DC

## 2015-09-22 NOTE — Telephone Encounter (Signed)
Rx sent electronically to pharmacy. Patient notified and stated he will call back in one month with his sugar readings to see if the dosage need to be adjusted.

## 2015-09-22 NOTE — Telephone Encounter (Signed)
farxiga 5 mg numb 30 or 90npt pref, six mo woreth , one qam, can go to ten mg if [place], call in one mo with numbers

## 2015-09-23 ENCOUNTER — Telehealth: Payer: Self-pay | Admitting: Family Medicine

## 2015-09-23 NOTE — Telephone Encounter (Signed)
That is completely wrong that you don't take this class of meds with bp meds, stay on lower dose of losartan 50 if pt worried about it, we'll reassess bp at f u visit as scheduled, if pt would like to disc further rec o v

## 2015-09-23 NOTE — Telephone Encounter (Signed)
THIS IS LOSARTAN NOT SIMVASTATIN (pt called back )   2) pt is concerned about taking the [place] with the BP med Pharmacist told him to call here to express his concern  especially with you increasing his Bp med. The warning  Label with this med says not to take with BP meds that it Could lower is BP

## 2015-09-23 NOTE — Telephone Encounter (Signed)
Pt is calling to say that he thought the Simvastatin was supposed  To be 80 mg not the 40 he was taking due to issues with  Hypertension.  Please advise, he is still taking only the 40 until we  Call to verify with him to increase his dose. It was renewed at his appt  Last week but not for the correct dose    [facility]

## 2015-09-23 NOTE — Telephone Encounter (Signed)
Left message to return call 

## 2015-09-24 NOTE — Telephone Encounter (Signed)
Discussed with patient. Patient verbalized understanding.  Patient stated he is comfortable with the increase in [place] and will increase as directed and have his family member check blood pressure.

## 2015-09-29 ENCOUNTER — Other Ambulatory Visit: Payer: Self-pay | Admitting: *Deleted

## 2015-09-29 ENCOUNTER — Telehealth: Payer: Self-pay | Admitting: Family Medicine

## 2015-09-29 DIAGNOSIS — E785 Hyperlipidemia, unspecified: Secondary | ICD-10-CM

## 2015-09-29 DIAGNOSIS — Z79899 Other long term (current) drug therapy: Secondary | ICD-10-CM

## 2015-09-29 NOTE — Telephone Encounter (Signed)
Orders ready. Pt notified.  

## 2015-09-29 NOTE — Telephone Encounter (Signed)
Pt needs labs for [facility] instead of [facility],  Please redo and call pt when ready for pick up

## 2015-10-04 LAB — HEPATIC FUNCTION PANEL
ALT: 23 U/L (ref 9–46)
AST: 16 U/L (ref 10–35)
Albumin: 4.7 g/dL (ref 3.6–5.1)
Alkaline Phosphatase: 58 U/L (ref 40–115)
BILIRUBIN DIRECT: 0.1 mg/dL (ref <=0.2)
BILIRUBIN TOTAL: 0.4 mg/dL (ref 0.2–1.2)
Indirect Bilirubin: 0.3 mg/dL (ref 0.2–1.2)
Total Protein: 7 g/dL (ref 6.1–8.1)

## 2015-10-04 LAB — LIPID PANEL
CHOL/HDL RATIO: 3.3 ratio (ref <=5.0)
CHOLESTEROL: 135 mg/dL (ref 125–200)
HDL: 41 mg/dL (ref >=40)
LDL CALC: 56 mg/dL (ref <130)
Triglycerides: 192 mg/dL — ABNORMAL HIGH (ref <150)
VLDL: 38 mg/dL — AB (ref <30)

## 2015-10-05 ENCOUNTER — Encounter: Payer: Self-pay | Admitting: Family Medicine

## 2015-11-07 LAB — HM DIABETES EYE EXAM

## 2015-11-08 ENCOUNTER — Other Ambulatory Visit: Payer: Self-pay | Admitting: Family Medicine

## 2015-11-21 ENCOUNTER — Encounter: Payer: Self-pay | Admitting: *Deleted

## 2016-01-25 ENCOUNTER — Other Ambulatory Visit: Payer: Self-pay | Admitting: Family Medicine

## 2016-03-08 ENCOUNTER — Other Ambulatory Visit: Payer: Self-pay | Admitting: Family Medicine

## 2016-03-13 ENCOUNTER — Other Ambulatory Visit: Payer: Self-pay | Admitting: Family Medicine

## 2016-03-23 ENCOUNTER — Encounter: Payer: BLUE CROSS/BLUE SHIELD | Admitting: Family Medicine

## 2016-03-27 ENCOUNTER — Other Ambulatory Visit: Payer: Self-pay | Admitting: Family Medicine

## 2016-04-05 ENCOUNTER — Encounter: Payer: Self-pay | Admitting: Family Medicine

## 2016-04-12 ENCOUNTER — Encounter: Payer: BLUE CROSS/BLUE SHIELD | Admitting: Family Medicine

## 2016-04-15 ENCOUNTER — Encounter: Payer: Self-pay | Admitting: Family Medicine

## 2016-04-15 ENCOUNTER — Ambulatory Visit (INDEPENDENT_AMBULATORY_CARE_PROVIDER_SITE_OTHER): Payer: BLUE CROSS/BLUE SHIELD | Admitting: Family Medicine

## 2016-04-15 VITALS — BP 136/90 | Ht 66.5 in | Wt 200.4 lb

## 2016-04-15 DIAGNOSIS — E785 Hyperlipidemia, unspecified: Secondary | ICD-10-CM | POA: Diagnosis not present

## 2016-04-15 DIAGNOSIS — Z125 Encounter for screening for malignant neoplasm of prostate: Secondary | ICD-10-CM | POA: Diagnosis not present

## 2016-04-15 DIAGNOSIS — E119 Type 2 diabetes mellitus without complications: Secondary | ICD-10-CM

## 2016-04-15 DIAGNOSIS — Z79899 Other long term (current) drug therapy: Secondary | ICD-10-CM

## 2016-04-15 DIAGNOSIS — I10 Essential (primary) hypertension: Secondary | ICD-10-CM

## 2016-04-15 LAB — POCT GLYCOSYLATED HEMOGLOBIN (HGB A1C): Hemoglobin A1C: 6.3

## 2016-04-15 MED ORDER — LOSARTAN POTASSIUM 50 MG PO TABS: 50.0000 mg | ORAL_TABLET | Freq: Every day | ORAL | 1 refills | Status: DC

## 2016-04-15 MED ORDER — DAPAGLIFLOZIN PROPANEDIOL 5 MG PO TABS: 5.0000 mg | ORAL_TABLET | Freq: Every day | ORAL | 1 refills | Status: DC

## 2016-04-15 MED ORDER — GLIPIZIDE 5 MG PO TABS: 10.0000 mg | ORAL_TABLET | Freq: Two times a day (BID) | ORAL | 1 refills | Status: DC

## 2016-04-15 MED ORDER — PANTOPRAZOLE SODIUM 40 MG PO TBEC: 40.0000 mg | DELAYED_RELEASE_TABLET | Freq: Every day | ORAL | 1 refills | Status: DC

## 2016-04-15 MED ORDER — SIMVASTATIN 40 MG PO TABS: 40.0000 mg | ORAL_TABLET | Freq: Every day | ORAL | 1 refills | Status: DC

## 2016-04-15 MED ORDER — METFORMIN HCL 1000 MG PO TABS: 1000.0000 mg | ORAL_TABLET | Freq: Two times a day (BID) | ORAL | 1 refills | Status: DC

## 2016-04-15 NOTE — Progress Notes (Signed)
   Subjective:    Patient ID: Tristan Mccarty, male    DOB: September 13, 1955, 60 y.o.   MRN: [id number]  Diabetes  He presents for his follow-up diabetic visit. He has type 2 diabetes mellitus. There are no hypoglycemic associated symptoms. There are no diabetic associated symptoms. There are no hypoglycemic complications. There are no diabetic complications. There are no known risk factors for coronary artery disease. Current diabetic treatment includes oral agent (triple therapy). He is compliant with treatment all of the time.   Results for orders placed or performed in visit on 04/15/16  POCT glycosylated hemoglobin (Hb A1C)  Result Value Ref Range   Hemoglobin A1C 6.3    Working on diet and exr  And has lost fourten pounds.   Patient states that his legs swell and the bottom of his feet are very dry. He wants to know if compression stockings will help with this.   Patient received a tick bite back in June and he wants the doctor to take a look at it.   Blood pressure medicine and blood pressure levels reviewed today with patient. Compliant with blood pressure medicine. States does not miss a dose. No obvious side effects. Blood pressure generally good when checked elsewhere. Watching salt intake.  Patient continues to take lipid medication regularly. No obvious side effects from it. Generally does not miss a dose. Prior blood work results are reviewed with patient. Patient continues to work on fat intake in diet  Patient claims compliance with diabetes medication. No obvious side effects. Reports no substantial low sugar spells. Most numbers are generally in good range when checked fasting. Generally does not miss a dose of medication. Watching diabetic diet closely  Has had a couple low sugar spells.  Already had flu shot       Review of Systems No headache, no major weight loss or weight gain, no chest pain no back pain abdominal pain no change in bowel habits complete ROS  otherwise negative     Objective:   Physical Exam  Alert vitals stable, NAD. Blood pressure good on repeat. HEENT normal. Lungs clear. Heart regular rate and rhythm. Diabetic foot exam pulses intact sensation good trace edema      Assessment & Plan:  Impression 1 type 2 diabetes control much improved discussed maintain same #2 hyperlipidemia controlled good prior blood work reviewed discussed #3 hypertension good control discussed maintain same plan diet exercise discussed. Medications refilled. Blood work ordered. Multiple questions answered

## 2016-05-09 ENCOUNTER — Other Ambulatory Visit: Payer: Self-pay | Admitting: Family Medicine

## 2016-05-14 LAB — BASIC METABOLIC PANEL
BUN/Creatinine Ratio: 16 (ref 9–20)
BUN: 13 mg/dL (ref 6–24)
CO2: 21 mmol/L (ref 18–29)
Calcium: 9.4 mg/dL (ref 8.7–10.2)
Chloride: 102 mmol/L (ref 96–106)
Creatinine, Ser: 0.8 mg/dL (ref 0.76–1.27)
GFR, EST AFRICAN AMERICAN: 114 mL/min/{1.73_m2} (ref >59)
GFR, EST NON AFRICAN AMERICAN: 98 mL/min/{1.73_m2} (ref >59)
Glucose: 151 mg/dL — ABNORMAL HIGH (ref 65–99)
POTASSIUM: 4.8 mmol/L (ref 3.5–5.2)
SODIUM: 141 mmol/L (ref 134–144)

## 2016-05-14 LAB — HEPATIC FUNCTION PANEL
ALBUMIN: 4.5 g/dL (ref 3.5–5.5)
ALK PHOS: 71 IU/L (ref 39–117)
ALT: 18 IU/L (ref 0–44)
AST: 12 IU/L (ref 0–40)
Bilirubin Total: 0.3 mg/dL (ref 0.0–1.2)
Bilirubin, Direct: 0.1 mg/dL (ref 0.00–0.40)
Total Protein: 7 g/dL (ref 6.0–8.5)

## 2016-05-14 LAB — LIPID PANEL
CHOLESTEROL TOTAL: 143 mg/dL (ref 100–199)
Chol/HDL Ratio: 3.3 ratio units (ref 0.0–5.0)
HDL: 43 mg/dL (ref >39)
LDL Calculated: 68 mg/dL (ref 0–99)
TRIGLYCERIDES: 161 mg/dL — AB (ref 0–149)
VLDL CHOLESTEROL CAL: 32 mg/dL (ref 5–40)

## 2016-05-14 LAB — MICROALBUMIN / CREATININE URINE RATIO
CREATININE, UR: 180.9 mg/dL
Microalb/Creat Ratio: 8.1 mg/g creat (ref 0.0–30.0)
Microalbumin, Urine: 14.7 ug/mL

## 2016-05-14 LAB — PSA: Prostate Specific Ag, Serum: 2.4 ng/mL (ref 0.0–4.0)

## 2016-05-17 ENCOUNTER — Encounter: Payer: Self-pay | Admitting: Family Medicine

## 2016-05-19 ENCOUNTER — Other Ambulatory Visit: Payer: Self-pay | Admitting: *Deleted

## 2016-05-19 MED ORDER — GLUCOSE BLOOD VI STRP: ORAL_STRIP | 1 refills | Status: DC

## 2016-05-20 ENCOUNTER — Other Ambulatory Visit: Payer: Self-pay | Admitting: *Deleted

## 2016-05-20 MED ORDER — GLUCOSE BLOOD VI STRP: ORAL_STRIP | 5 refills | Status: DC

## 2016-06-18 ENCOUNTER — Ambulatory Visit (INDEPENDENT_AMBULATORY_CARE_PROVIDER_SITE_OTHER): Payer: Managed Care, Other (non HMO) | Admitting: Family Medicine

## 2016-06-18 ENCOUNTER — Encounter: Payer: Self-pay | Admitting: Family Medicine

## 2016-06-18 VITALS — BP 134/86 | Ht 66.25 in | Wt 200.6 lb

## 2016-06-18 DIAGNOSIS — Z0000 Encounter for general adult medical examination without abnormal findings: Secondary | ICD-10-CM | POA: Diagnosis not present

## 2016-06-18 NOTE — Patient Instructions (Signed)
Results for orders placed or performed in visit on 04/15/16  Lipid panel  Result Value Ref Range   Cholesterol, Total 143 100 - 199 mg/dL   Triglycerides 161 (H) 0 - 149 mg/dL   HDL 43 >39 mg/dL   VLDL Cholesterol Cal 32 5 - 40 mg/dL   LDL Calculated 68 0 - 99 mg/dL   Chol/HDL Ratio 3.3 0.0 - 5.0 ratio units  Hepatic function panel  Result Value Ref Range   Total Protein 7.0 6.0 - 8.5 g/dL   Albumin 4.5 3.5 - 5.5 g/dL   Bilirubin Total 0.3 0.0 - 1.2 mg/dL   Bilirubin, Direct 0.10 0.00 - 0.40 mg/dL   Alkaline Phosphatase 71 39 - 117 IU/L   AST 12 0 - 40 IU/L   ALT 18 0 - 44 IU/L  Basic metabolic panel  Result Value Ref Range   Glucose 151 (H) 65 - 99 mg/dL   BUN 13 6 - 24 mg/dL   Creatinine, Ser 0.80 0.76 - 1.27 mg/dL   GFR calc non Af Amer 98 >59 mL/min/1.73   GFR calc Af Amer 114 >59 mL/min/1.73   BUN/Creatinine Ratio 16 9 - 20   Sodium 141 134 - 144 mmol/L   Potassium 4.8 3.5 - 5.2 mmol/L   Chloride 102 96 - 106 mmol/L   CO2 21 18 - 29 mmol/L   Calcium 9.4 8.7 - 10.2 mg/dL  PSA  Result Value Ref Range   Prostate Specific Ag, Serum 2.4 0.0 - 4.0 ng/mL  Microalbumin / creatinine urine ratio  Result Value Ref Range   Creatinine, Urine 180.9 Not Estab. mg/dL   Albumin, Urine 14.7 Not Estab. ug/mL   Microalb/Creat Ratio 8.1 0.0 - 30.0 mg/g creat  POCT glycosylated hemoglobin (Hb A1C)  Result Value Ref Range   Hemoglobin A1C 6.3

## 2016-06-18 NOTE — Progress Notes (Signed)
   Subjective:    Patient ID: Tristan Mccarty, male    DOB: [birth year], 61 y.o.   MRN: [id number]  HPIpt arrives today to have biometric screening done. Pt brought in form that needs to be filled out. Pt states no concerns today.   eercise not very well, not very active at all  Always wide open  Flu shot already given   Colonoscopy not done yet   No hx of colon canc er or psrotate cancer  sg                                   hingles vaccine given already    Tet shot four yrs ago  gtries to watch diet, dificult with salds and such, watching iet with diab hx  Review of Systems  Constitutional: Negative for activity change, appetite change and fever.  HENT: Negative for congestion and rhinorrhea.   Eyes: Negative for discharge.  Respiratory: Negative for cough and wheezing.   Cardiovascular: Negative for chest pain.  Gastrointestinal: Negative for abdominal pain, blood in stool and vomiting.  Genitourinary: Negative for difficulty urinating and frequency.  Musculoskeletal: Negative for neck pain.  Skin: Negative for rash.  Allergic/Immunologic: Negative for environmental allergies and food allergies.  Neurological: Negative for weakness and headaches.  Psychiatric/Behavioral: Negative for agitation.  All other systems reviewed and are negative.      Objective:   Physical Exam  Constitutional: He appears well-developed and well-nourished.  HENT:  Head: Normocephalic and atraumatic.  Right Ear: External ear normal.  Left Ear: External ear normal.  Nose: Nose normal.  Mouth/Throat: Oropharynx is clear and moist.  Eyes: EOM are normal. Pupils are equal, round, and reactive to light.  Neck: Normal range of motion. Neck supple. No thyromegaly present.  Cardiovascular: Normal rate, regular rhythm and normal heart sounds.   No murmur heard. Pulmonary/Chest: Effort normal and breath sounds normal. No respiratory distress. He has no wheezes.    Abdominal: Soft. Bowel sounds are normal. He exhibits no distension and no mass. There is no tenderness.  Genitourinary: Penis normal.  Genitourinary Comments: Prostate exam within normal limits  Musculoskeletal: Normal range of motion. He exhibits no edema.  Lymphadenopathy:    He has no cervical adenopathy.  Neurological: He is alert. He exhibits normal muscle tone.  Skin: Skin is warm and dry. No erythema.  Psychiatric: He has a normal mood and affect. His behavior is normal. Judgment normal.  Vitals reviewed.         Assessment & Plan:  Impression wellness exam plan biometric screening filled out. Diet exercise discussed. Up-to-date on colonoscopy. Up-to-date on vaccinations. Forms submitted follow-up for chronic as scheduled in about 4 months(

## 2016-08-02 ENCOUNTER — Telehealth: Payer: Self-pay | Admitting: Family Medicine

## 2016-08-02 MED ORDER — BENZONATATE 100 MG PO CAPS: 100.0000 mg | ORAL_CAPSULE | Freq: Three times a day (TID) | ORAL | 1 refills | Status: DC | PRN

## 2016-08-02 NOTE — Telephone Encounter (Signed)
CALL IN THE PRESCRIPTION TO [facility] [address] [place] [phone]

## 2016-08-02 NOTE — Telephone Encounter (Signed)
Prescription sent electronically to pharmacy. Patient notified. 

## 2016-08-02 NOTE — Telephone Encounter (Signed)
Tess perles 100 mg 36 one tid prn cough, one ref

## 2016-08-02 NOTE — Telephone Encounter (Incomplete)
Spoke with patient and patient stated that he has a dry cough. No SOB. 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 States he has tried cough drops and they do help some. States we can call him tomorrow and he will let us know which pharmacy we can go to. Please advise?

## 2016-08-02 NOTE — Telephone Encounter (Signed)
Patient has a cough and is requesting Rx to help his cough.  He is hoping this can be done by 10 am this morning because he has to leave to catch a flight.  [facility]

## 2016-10-07 ENCOUNTER — Ambulatory Visit (INDEPENDENT_AMBULATORY_CARE_PROVIDER_SITE_OTHER): Payer: Managed Care, Other (non HMO) | Admitting: Family Medicine

## 2016-10-07 ENCOUNTER — Ambulatory Visit: Payer: Managed Care, Other (non HMO) | Admitting: Family Medicine

## 2016-10-07 ENCOUNTER — Encounter: Payer: Self-pay | Admitting: Family Medicine

## 2016-10-07 VITALS — BP 120/80 | Temp 98.5°F | Ht 66.25 in | Wt 201.1 lb

## 2016-10-07 DIAGNOSIS — J329 Chronic sinusitis, unspecified: Secondary | ICD-10-CM

## 2016-10-07 DIAGNOSIS — H6502 Acute serous otitis media, left ear: Secondary | ICD-10-CM

## 2016-10-07 DIAGNOSIS — J310 Chronic rhinitis: Secondary | ICD-10-CM

## 2016-10-07 LAB — HM DIABETES EYE EXAM

## 2016-10-07 MED ORDER — AMOXICILLIN-POT CLAVULANATE 875-125 MG PO TABS: ORAL_TABLET | ORAL | 0 refills | Status: DC

## 2016-10-07 MED ORDER — BENZONATATE 100 MG PO CAPS: 100.0000 mg | ORAL_CAPSULE | Freq: Three times a day (TID) | ORAL | 1 refills | Status: DC | PRN

## 2016-10-07 MED ORDER — KETOCONAZOLE 2 % EX CREA: 1.0000 "application " | TOPICAL_CREAM | Freq: Two times a day (BID) | CUTANEOUS | 2 refills | Status: DC

## 2016-10-07 NOTE — Progress Notes (Signed)
   Subjective:    Patient ID: Tristan Mccarty, male    DOB: 06-Feb-1956, 61 y.o.   MRN: [id number]  Cough  This is a new problem. The current episode started 1 to 4 weeks ago. The cough is non-productive.   [place] since first of march, developed cough, tyook med went away, ten cam ack last wk  Sinus symtom and cong and cough, cough violent at times  Lost voice  Sugar elev due to bad cough  Frontal sins pressure nd disofort    Patient also has concerns of blood blisters to right foot.  Review of Systems  Respiratory: Positive for cough.        Objective:   Physical Exam  Alert, mild malaise. Hydration good Vitals stable. frontal/ maxillary tenderness evident positive nasal congestion. pharynx normal neck supple  lungs clear/no crackles or wheezes. heart regular in rhythm       Assessment & Plan:  Impression rhinosinusitis likely post viral, discussed with patient. plan antibiotics prescribed. Questions answered. Symptomatic care discussed. warning signs discussed. Nord with left otitis media and bronchitis. Symptom care discussed

## 2016-10-15 ENCOUNTER — Ambulatory Visit: Payer: Managed Care, Other (non HMO) | Admitting: Family Medicine

## 2016-10-19 ENCOUNTER — Encounter: Payer: Self-pay | Admitting: *Deleted

## 2016-10-22 ENCOUNTER — Ambulatory Visit: Payer: Managed Care, Other (non HMO) | Admitting: Family Medicine

## 2016-11-02 ENCOUNTER — Encounter: Payer: Self-pay | Admitting: Family Medicine

## 2016-11-21 ENCOUNTER — Other Ambulatory Visit: Payer: Self-pay | Admitting: Family Medicine

## 2016-12-05 ENCOUNTER — Other Ambulatory Visit: Payer: Self-pay | Admitting: Family Medicine

## 2016-12-18 ENCOUNTER — Other Ambulatory Visit: Payer: Self-pay | Admitting: Family Medicine

## 2017-01-09 ENCOUNTER — Other Ambulatory Visit: Payer: Self-pay | Admitting: Family Medicine

## 2017-01-10 ENCOUNTER — Other Ambulatory Visit: Payer: Self-pay | Admitting: Family Medicine

## 2017-01-20 ENCOUNTER — Other Ambulatory Visit: Payer: Self-pay | Admitting: Family Medicine

## 2017-01-21 ENCOUNTER — Encounter: Payer: Self-pay | Admitting: Family Medicine

## 2017-01-21 ENCOUNTER — Ambulatory Visit (INDEPENDENT_AMBULATORY_CARE_PROVIDER_SITE_OTHER): Payer: Managed Care, Other (non HMO) | Admitting: Family Medicine

## 2017-01-21 VITALS — BP 160/90 | Temp 98.0°F | Ht 66.25 in | Wt 202.0 lb

## 2017-01-21 DIAGNOSIS — I10 Essential (primary) hypertension: Secondary | ICD-10-CM

## 2017-01-21 DIAGNOSIS — B353 Tinea pedis: Secondary | ICD-10-CM

## 2017-01-21 DIAGNOSIS — E785 Hyperlipidemia, unspecified: Secondary | ICD-10-CM

## 2017-01-21 DIAGNOSIS — Z79899 Other long term (current) drug therapy: Secondary | ICD-10-CM

## 2017-01-21 DIAGNOSIS — E119 Type 2 diabetes mellitus without complications: Secondary | ICD-10-CM | POA: Diagnosis not present

## 2017-01-21 MED ORDER — DAPAGLIFLOZIN PROPANEDIOL 5 MG PO TABS: 5.0000 mg | ORAL_TABLET | Freq: Every day | ORAL | 1 refills | Status: DC

## 2017-01-21 MED ORDER — LOSARTAN POTASSIUM 50 MG PO TABS
50.0000 mg | ORAL_TABLET | Freq: Every day | ORAL | 1 refills | Status: DC
Start: 2017-01-21 — End: 2017-04-04

## 2017-01-21 MED ORDER — SIMVASTATIN 40 MG PO TABS: 40.0000 mg | ORAL_TABLET | Freq: Every day | ORAL | 1 refills | Status: DC

## 2017-01-21 MED ORDER — GLIPIZIDE 5 MG PO TABS: 10.0000 mg | ORAL_TABLET | Freq: Two times a day (BID) | ORAL | 1 refills | Status: DC

## 2017-01-21 MED ORDER — METFORMIN HCL 1000 MG PO TABS: 1000.0000 mg | ORAL_TABLET | Freq: Two times a day (BID) | ORAL | 1 refills | Status: DC

## 2017-01-21 MED ORDER — PANTOPRAZOLE SODIUM 40 MG PO TBEC: 40.0000 mg | DELAYED_RELEASE_TABLET | Freq: Every day | ORAL | 1 refills | Status: DC

## 2017-01-21 NOTE — Progress Notes (Addendum)
   Subjective:    Patient ID: Tristan Mccarty, male    DOB: July 05, 1955, 61 y.o.   MRN: [id number]  HPI Patient here with Right foot sores, states is uncomfortable. Has had since January 2018. He puts a diabetic lotion on it,seems to help.Has changed socks.Not healing. He's been present for several months. Also has diabetes. Also needs to be followed up with his primary care doctor Dr. Richardson Landry Review of Systems  Constitutional: Negative for activity change, fatigue and fever.  Respiratory: Negative for cough and shortness of breath.   Cardiovascular: Negative for chest pain and leg swelling.  Gastrointestinal: Negative for abdominal pain and vomiting.  Neurological: Negative for weakness and headaches.  Psychiatric/Behavioral: Negative for confusion.  lb     Objective:   Physical Exam  Constitutional: He appears well-nourished. No distress.  Cardiovascular: Normal rate, regular rhythm and normal heart sounds.   No murmur heard. Pulmonary/Chest: Effort normal and breath sounds normal. No respiratory distress.  Musculoskeletal: He exhibits no edema.  Lymphadenopathy:    He has no cervical adenopathy.  Neurological: He is alert.  Psychiatric: His behavior is normal.  Vitals reviewed.   Lungs clear heart regular elevated blood pressure noted right foot has scaling areas consistent with tinea      Assessment & Plan:  Fungal culture Use Lamisil cream If not doing better over the next 3 weeks notify us May need oral new medication Patient to do lab work and follow-up in November for comprehensive checkup Blood pressure mildly elevated but patient had not been on medication recently

## 2017-01-22 ENCOUNTER — Other Ambulatory Visit: Payer: Self-pay | Admitting: Family Medicine

## 2017-02-15 ENCOUNTER — Encounter: Payer: Self-pay | Admitting: Family Medicine

## 2017-02-22 LAB — FUNGUS CULTURE W SMEAR

## 2017-03-16 ENCOUNTER — Other Ambulatory Visit: Payer: Self-pay | Admitting: Family Medicine

## 2017-03-20 ENCOUNTER — Other Ambulatory Visit: Payer: Self-pay | Admitting: Family Medicine

## 2017-03-31 LAB — BASIC METABOLIC PANEL
BUN / CREAT RATIO: 20 (ref 9–20)
BUN: 16 mg/dL (ref 6–24)
CHLORIDE: 104 mmol/L (ref 96–106)
CO2: 20 mmol/L (ref 20–29)
Calcium: 9.3 mg/dL (ref 8.7–10.2)
Creatinine, Ser: 0.79 mg/dL (ref 0.76–1.27)
GFR calc non Af Amer: 98 mL/min/{1.73_m2} (ref >59)
GFR, EST AFRICAN AMERICAN: 114 mL/min/{1.73_m2} (ref >59)
Glucose: 154 mg/dL — ABNORMAL HIGH (ref 65–99)
POTASSIUM: 4.6 mmol/L (ref 3.5–5.2)
Sodium: 143 mmol/L (ref 134–144)

## 2017-03-31 LAB — LIPID PANEL
CHOLESTEROL TOTAL: 167 mg/dL (ref 100–199)
Chol/HDL Ratio: 4.6 ratio (ref 0.0–5.0)
HDL: 36 mg/dL — AB (ref >39)
TRIGLYCERIDES: 446 mg/dL — AB (ref 0–149)

## 2017-03-31 LAB — HEPATIC FUNCTION PANEL
ALT: 19 IU/L (ref 0–44)
AST: 11 IU/L (ref 0–40)
Albumin: 4.5 g/dL (ref 3.5–5.5)
Alkaline Phosphatase: 66 IU/L (ref 39–117)
BILIRUBIN TOTAL: 0.3 mg/dL (ref 0.0–1.2)
Bilirubin, Direct: 0.09 mg/dL (ref 0.00–0.40)
Total Protein: 6.8 g/dL (ref 6.0–8.5)

## 2017-03-31 LAB — HEMOGLOBIN A1C
ESTIMATED AVERAGE GLUCOSE: 189 mg/dL
HEMOGLOBIN A1C: 8.2 % — AB (ref 4.8–5.6)

## 2017-04-04 ENCOUNTER — Encounter: Payer: Self-pay | Admitting: Family Medicine

## 2017-04-04 ENCOUNTER — Ambulatory Visit (INDEPENDENT_AMBULATORY_CARE_PROVIDER_SITE_OTHER): Payer: Managed Care, Other (non HMO) | Admitting: Family Medicine

## 2017-04-04 VITALS — BP 142/86 | Ht 66.25 in | Wt 207.0 lb

## 2017-04-04 DIAGNOSIS — E119 Type 2 diabetes mellitus without complications: Secondary | ICD-10-CM | POA: Diagnosis not present

## 2017-04-04 DIAGNOSIS — E785 Hyperlipidemia, unspecified: Secondary | ICD-10-CM

## 2017-04-04 DIAGNOSIS — I10 Essential (primary) hypertension: Secondary | ICD-10-CM | POA: Diagnosis not present

## 2017-04-04 MED ORDER — LOSARTAN POTASSIUM 50 MG PO TABS: 50.0000 mg | ORAL_TABLET | Freq: Every day | ORAL | 1 refills | Status: DC

## 2017-04-04 MED ORDER — SIMVASTATIN 40 MG PO TABS: 40.0000 mg | ORAL_TABLET | Freq: Every day | ORAL | 1 refills | Status: DC

## 2017-04-04 MED ORDER — PANTOPRAZOLE SODIUM 40 MG PO TBEC: 40.0000 mg | DELAYED_RELEASE_TABLET | Freq: Every day | ORAL | 1 refills | Status: DC

## 2017-04-04 MED ORDER — METFORMIN HCL 1000 MG PO TABS: 1000.0000 mg | ORAL_TABLET | Freq: Two times a day (BID) | ORAL | 1 refills | Status: DC

## 2017-04-04 MED ORDER — GLIPIZIDE 5 MG PO TABS: 10.0000 mg | ORAL_TABLET | Freq: Two times a day (BID) | ORAL | 1 refills | Status: DC

## 2017-04-04 MED ORDER — DAPAGLIFLOZIN PROPANEDIOL 10 MG PO TABS: 10.0000 mg | ORAL_TABLET | Freq: Every day | ORAL | 0 refills | Status: DC

## 2017-04-04 NOTE — Progress Notes (Signed)
Subjective:    Patient ID: Tristan Mccarty, male    DOB: June 24, 1955, 61 y.o.   MRN: [id number] Patient presents for numerous concerns Diabetes  He presents for his follow-up diabetic visit. He has type 2 diabetes mellitus. He is compliant with treatment all of the time. Home blood sugar record trend: blood sugar has been running high. 170's - 190's. He does not see a podiatrist.Eye exam is current (may 2018).   A1C done on bloodwork on 03/30/17. 8.2.  Results for orders placed or performed in visit on 01/21/17  Fungus Culture & Smear  Result Value Ref Range   Fungus Stain Final report    Result 1 Comment    Fungus (Mycology) Culture Final report    Fungal result 1 Trichophyton rubrum   Lipid panel  Result Value Ref Range   Cholesterol, Total 167 100 - 199 mg/dL   Triglycerides 446 (H) 0 - 149 mg/dL   HDL 36 (L) >39 mg/dL   VLDL Cholesterol Cal Comment 5 - 40 mg/dL   LDL Calculated Comment 0 - 99 mg/dL   Chol/HDL Ratio 4.6 0.0 - 5.0 ratio  Hepatic function panel  Result Value Ref Range   Total Protein 6.8 6.0 - 8.5 g/dL   Albumin 4.5 3.5 - 5.5 g/dL   Bilirubin Total 0.3 0.0 - 1.2 mg/dL   Bilirubin, Direct 0.09 0.00 - 0.40 mg/dL   Alkaline Phosphatase 66 39 - 117 IU/L   AST 11 0 - 40 IU/L   ALT 19 0 - 44 IU/L  Hemoglobin A1c  Result Value Ref Range   Hgb A1c MFr Bld 8.2 (H) 4.8 - 5.6 %   Est. average glucose Bld gHb Est-mCnc 189 mg/dL  Basic metabolic panel  Result Value Ref Range   Glucose 154 (H) 65 - 99 mg/dL   BUN 16 6 - 24 mg/dL   Creatinine, Ser 0.79 0.76 - 1.27 mg/dL   GFR calc non Af Amer 98 >59 mL/min/1.73   GFR calc Af Amer 114 >59 mL/min/1.73   BUN/Creatinine Ratio 20 9 - 20   Sodium 143 134 - 144 mmol/L   Potassium 4.6 3.5 - 5.2 mmol/L   Chloride 104 96 - 106 mmol/L   CO2 20 20 - 29 mmol/L   Calcium 9.3 8.7 - 10.2 mg/dL    Patient claims compliance with diabetes medication. No obvious side effects. Reports no substantial low sugar spells. Most numbers  are generally in good range when checked fasting. Generally does not miss a dose of medication. Watching diabetic diet closely  Blood pressure medicine and blood pressure levels reviewed today with patient. Compliant with blood pressure medicine. States does not miss a dose. No obvious side effects. Blood pressure generally good when checked elsewhere. Watching salt intake.  Patient continues to take lipid medication regularly. No obvious side effects from it. Generally does not miss a dose. Prior blood work results are reviewed with patient. Patient continues to work on fat intake in diet   Concerns about taking farxiga.    Review of Systems No headache, no major weight loss or weight gain, no chest pain no back pain abdominal pain no change in bowel habits complete ROS otherwise negative     Objective:   Physical Exam Alert and oriented, vitals reviewed and stable, NAD ENT-TM's and ext canals WNL bilat via otoscopic exam Soft palate, tonsils and post pharynx WNL via oropharyngeal exam Neck-symmetric, no masses; thyroid nonpalpable and nontender Pulmonary-no tachypnea or accessory muscle use;  Clear without wheezes via auscultation Card--no abnrml murmurs, rhythm reg and rate WNL Carotid pulses symmetric, without bruits        Assessment & Plan:  Impression 1 hypertension.  Good control discussed.  Maintain medications  2.  Hyperlipidemia.  Prior blood work discussed.  Months discussed to maintain same meds  3.  Type 2 diabetes suboptimal control.  Plus minus diet and exercise likely.  Long discussion held regarding Arciga.  Low risk occasions complications.  Increase dose to 10 mg.  Rationale discussed  Greater than 50% of this 25 minute face to face visit was spent in counseling and discussion and coordination of care regarding the above diagnosis/diagnosies

## 2017-07-09 ENCOUNTER — Other Ambulatory Visit: Payer: Self-pay | Admitting: Family Medicine

## 2017-10-03 ENCOUNTER — Ambulatory Visit (INDEPENDENT_AMBULATORY_CARE_PROVIDER_SITE_OTHER): Payer: Managed Care, Other (non HMO) | Admitting: Family Medicine

## 2017-10-03 ENCOUNTER — Encounter: Payer: Self-pay | Admitting: Family Medicine

## 2017-10-03 VITALS — BP 148/86 | Ht 66.0 in | Wt 207.4 lb

## 2017-10-03 DIAGNOSIS — Z125 Encounter for screening for malignant neoplasm of prostate: Secondary | ICD-10-CM | POA: Diagnosis not present

## 2017-10-03 DIAGNOSIS — I10 Essential (primary) hypertension: Secondary | ICD-10-CM

## 2017-10-03 DIAGNOSIS — Z0001 Encounter for general adult medical examination with abnormal findings: Secondary | ICD-10-CM | POA: Diagnosis not present

## 2017-10-03 DIAGNOSIS — Z0000 Encounter for general adult medical examination without abnormal findings: Secondary | ICD-10-CM

## 2017-10-03 DIAGNOSIS — E785 Hyperlipidemia, unspecified: Secondary | ICD-10-CM

## 2017-10-03 DIAGNOSIS — E119 Type 2 diabetes mellitus without complications: Secondary | ICD-10-CM

## 2017-10-03 LAB — POCT GLYCOSYLATED HEMOGLOBIN (HGB A1C): Hemoglobin A1C: 7.5

## 2017-10-03 MED ORDER — PANTOPRAZOLE SODIUM 40 MG PO TBEC: 40.0000 mg | DELAYED_RELEASE_TABLET | Freq: Every day | ORAL | 1 refills | Status: DC

## 2017-10-03 MED ORDER — SIMVASTATIN 40 MG PO TABS: 40.0000 mg | ORAL_TABLET | Freq: Every day | ORAL | 1 refills | Status: DC

## 2017-10-03 MED ORDER — GLIPIZIDE 5 MG PO TABS: 10.0000 mg | ORAL_TABLET | Freq: Two times a day (BID) | ORAL | 1 refills | Status: DC

## 2017-10-03 MED ORDER — METFORMIN HCL 1000 MG PO TABS: 1000.0000 mg | ORAL_TABLET | Freq: Two times a day (BID) | ORAL | 1 refills | Status: DC

## 2017-10-03 MED ORDER — LOSARTAN POTASSIUM 100 MG PO TABS: 100.0000 mg | ORAL_TABLET | Freq: Every day | ORAL | 1 refills | Status: DC

## 2017-10-03 MED ORDER — DAPAGLIFLOZIN PROPANEDIOL 10 MG PO TABS: 10.0000 mg | ORAL_TABLET | Freq: Every day | ORAL | 1 refills | Status: DC

## 2017-10-03 NOTE — Progress Notes (Signed)
Subjective:    Patient ID: Tristan Mccarty, male    DOB: 1955/10/08, 62 y.o.   MRN: [id number]  HPI  The patient comes in today for a wellness visit.    A review of their health history was completed.  A review of medications was also completed.  Any needed refills; yes  Eating habits: trying to eat healthy  Falls/  MVA accidents in past few months: none  Regular exercise: working around house  Specialist pt sees on regular basis: podiatrist and eye doctor  Preventative health issues were discussed.   Additional concerns: sugars running high  Blood pressure medicine and blood pressure levels reviewed today with patient. Compliant with blood pressure medicine. States does not miss a dose. No obvious side effects. Blood pressure generally good when checked elsewhere. Watching salt intake.   Patient claims compliance with diabetes medication. No obvious side effects. Reports no substantial low sugar spells. Most numbers are generally in good range when checked fasting. Generally does not miss a dose of medication. Watching diabetic diet closely  Patient continues to take lipid medication regularly. No obvious side effects from it. Generally does not miss a dose. Prior blood work results are reviewed with patient. Patient continues to work on fat intake in diet  Results for orders placed or performed in visit on 10/03/17  POCT glycosylated hemoglobin (Hb A1C)  Result Value Ref Range   Hemoglobin A1C 7.5    Exercise going fairly well  aveage reg phys activity  aslo report s ave diet     Compliant with meds regulalrly       Review of Systems  Constitutional: Negative for activity change, appetite change and fever.  HENT: Negative for congestion and rhinorrhea.   Eyes: Negative for discharge.  Respiratory: Negative for cough and wheezing.   Cardiovascular: Negative for chest pain.  Gastrointestinal: Negative for abdominal pain, blood in stool and vomiting.    Genitourinary: Negative for difficulty urinating and frequency.  Musculoskeletal: Negative for neck pain.  Skin: Negative for rash.  Allergic/Immunologic: Negative for environmental allergies and food allergies.  Neurological: Negative for weakness and headaches.  Psychiatric/Behavioral: Negative for agitation.  All other systems reviewed and are negative.      Objective:   Physical Exam  Constitutional: He appears well-developed and well-nourished.  HENT:  Head: Normocephalic and atraumatic.  Right Ear: External ear normal.  Left Ear: External ear normal.  Nose: Nose normal.  Mouth/Throat: Oropharynx is clear and moist.  Eyes: Pupils are equal, round, and reactive to light. EOM are normal.  Neck: Normal range of motion. Neck supple. No thyromegaly present.  Cardiovascular: Normal rate, regular rhythm and normal heart sounds.  No murmur heard. Pulmonary/Chest: Effort normal and breath sounds normal. No respiratory distress. He has no wheezes.  Abdominal: Soft. Bowel sounds are normal. He exhibits no distension and no mass. There is no tenderness.  Genitourinary: Penis normal.  Musculoskeletal: Normal range of motion. He exhibits no edema.  Lymphadenopathy:    He has no cervical adenopathy.  Neurological: He is alert. He exhibits normal muscle tone.  Skin: Skin is warm and dry. No erythema.  Psychiatric: He has a normal mood and affect. His behavior is normal. Judgment normal.  Vitals reviewed.         Assessment & Plan:  Done colonoscopy in 2011 next due in ten yrs  Impression 1 wellness exam.  Colonoscopy not due yet due in 2021.  Diet discussed.  Exercise discussed.  Appropriate blood  work discussed and ordered  2.  Hypertension.  Suboptimal control.  Discussed.  Patient concerned.  Will increase losartan rationale discussed  3.  Type 2 diabetes.  A1c 7.5.  Discussed this is still considered overall in decent control.  Will maintain same for now.  4.   Hyperlipidemia.  Prior blood work reviewed.  Compliance discussed to maintain same pending blood work results

## 2017-10-10 ENCOUNTER — Encounter: Payer: Self-pay | Admitting: Family Medicine

## 2017-11-17 ENCOUNTER — Encounter: Payer: Self-pay | Admitting: Family Medicine

## 2017-11-17 LAB — LIPID PANEL
CHOL/HDL RATIO: 4.5 ratio (ref 0.0–5.0)
CHOLESTEROL TOTAL: 162 mg/dL (ref 100–199)
HDL: 36 mg/dL — AB (ref >39)
Triglycerides: 402 mg/dL — ABNORMAL HIGH (ref 0–149)

## 2017-11-17 LAB — BASIC METABOLIC PANEL
BUN / CREAT RATIO: 13 (ref 9–20)
BUN: 13 mg/dL (ref 6–24)
CALCIUM: 9.8 mg/dL (ref 8.7–10.2)
CHLORIDE: 101 mmol/L (ref 96–106)
CO2: 20 mmol/L (ref 20–29)
Creatinine, Ser: 0.97 mg/dL (ref 0.76–1.27)
GFR calc Af Amer: 98 mL/min/{1.73_m2} (ref >59)
GFR calc non Af Amer: 85 mL/min/{1.73_m2} (ref >59)
GLUCOSE: 236 mg/dL — AB (ref 65–99)
POTASSIUM: 5 mmol/L (ref 3.5–5.2)
Sodium: 140 mmol/L (ref 134–144)

## 2017-11-17 LAB — MICROALBUMIN / CREATININE URINE RATIO
Creatinine, Urine: 72.2 mg/dL
Microalb/Creat Ratio: <4.2 mg/g creat (ref 0.0–30.0)
Microalbumin, Urine: <3 ug/mL

## 2017-11-17 LAB — HEPATIC FUNCTION PANEL
ALT: 18 IU/L (ref 0–44)
AST: 12 IU/L (ref 0–40)
Albumin: 4.4 g/dL (ref 3.5–5.5)
Alkaline Phosphatase: 66 IU/L (ref 39–117)
Bilirubin Total: 0.2 mg/dL (ref 0.0–1.2)
Bilirubin, Direct: 0.1 mg/dL (ref 0.00–0.40)
Total Protein: 6.9 g/dL (ref 6.0–8.5)

## 2017-11-17 LAB — PSA: PROSTATE SPECIFIC AG, SERUM: 2.1 ng/mL (ref 0.0–4.0)

## 2018-01-14 ENCOUNTER — Other Ambulatory Visit: Payer: Self-pay | Admitting: Family Medicine

## 2018-04-09 ENCOUNTER — Other Ambulatory Visit: Payer: Self-pay | Admitting: Family Medicine

## 2018-04-25 ENCOUNTER — Encounter: Payer: Self-pay | Admitting: Family Medicine

## 2018-04-25 ENCOUNTER — Ambulatory Visit (INDEPENDENT_AMBULATORY_CARE_PROVIDER_SITE_OTHER): Payer: BLUE CROSS/BLUE SHIELD | Admitting: Family Medicine

## 2018-04-25 VITALS — BP 128/82 | Ht 66.0 in | Wt 207.0 lb

## 2018-04-25 DIAGNOSIS — I10 Essential (primary) hypertension: Secondary | ICD-10-CM | POA: Diagnosis not present

## 2018-04-25 DIAGNOSIS — E119 Type 2 diabetes mellitus without complications: Secondary | ICD-10-CM | POA: Diagnosis not present

## 2018-04-25 DIAGNOSIS — E785 Hyperlipidemia, unspecified: Secondary | ICD-10-CM | POA: Diagnosis not present

## 2018-04-25 LAB — POCT GLYCOSYLATED HEMOGLOBIN (HGB A1C): Hemoglobin A1C: 7.3 % — AB (ref 4.0–5.6)

## 2018-04-25 MED ORDER — METFORMIN HCL 1000 MG PO TABS: 1000.0000 mg | ORAL_TABLET | Freq: Two times a day (BID) | ORAL | 1 refills | Status: DC

## 2018-04-25 MED ORDER — DAPAGLIFLOZIN PROPANEDIOL 10 MG PO TABS: 10.0000 mg | ORAL_TABLET | Freq: Every day | ORAL | 1 refills | Status: DC

## 2018-04-25 MED ORDER — PANTOPRAZOLE SODIUM 40 MG PO TBEC: 40.0000 mg | DELAYED_RELEASE_TABLET | Freq: Every day | ORAL | 1 refills | Status: DC

## 2018-04-25 MED ORDER — GLIPIZIDE 5 MG PO TABS: 10.0000 mg | ORAL_TABLET | Freq: Two times a day (BID) | ORAL | 1 refills | Status: DC

## 2018-04-25 MED ORDER — LOSARTAN POTASSIUM 100 MG PO TABS: 100.0000 mg | ORAL_TABLET | Freq: Every day | ORAL | 1 refills | Status: DC

## 2018-04-25 MED ORDER — SIMVASTATIN 40 MG PO TABS: 40.0000 mg | ORAL_TABLET | Freq: Every day | ORAL | 1 refills | Status: DC

## 2018-04-25 NOTE — Progress Notes (Signed)
   Subjective:    Patient ID: Tristan Mccarty, male    DOB: 12-24-1955, 62 y.o.   MRN: [id number]  Diabetes  He presents for his follow-up diabetic visit. He has type 2 diabetes mellitus. Risk factors for coronary artery disease include diabetes mellitus, dyslipidemia and hypertension. Current diabetic treatment includes oral agent (triple therapy). He is compliant with treatment all of the time. His weight is stable. He is following a diabetic diet. He has not had a previous visit with a dietitian. He does not see a podiatrist.Eye exam is not current (needs to make an appt).    Results for orders placed or performed in visit on 04/25/18  POCT glycosylated hemoglobin (Hb A1C)  Result Value Ref Range   Hemoglobin A1C 7.3 (A) 4.0 - 5.6 %   HbA1c POC (<> result, manual entry)     HbA1c, POC (prediabetic range)     HbA1c, POC (controlled diabetic range)     Blood pressure medicine and blood pressure levels reviewed today with patient. Compliant with blood pressure medicine. States does not miss a dose. No obvious side effects. Blood pressure generally good when checked elsewhere. Watching salt intake.    Patient claims compliance with diabetes medication. No obvious side effects. Reports no substantial low sugar spells. Most numbers are generally in good range when checked fasting. Generally does not miss a dose of medication. Watching diabetic diet closely  Patient continues to take lipid medication regularly. No obvious side effects from it. Generally does not miss a dose. Prior blood work results are reviewed with patient. Patient continues to work on fat intake in diet  exercuise getting a lot    Review of Systems No headache, no major weight loss or weight gain, no chest pain no back pain abdominal pain no change in bowel habits complete ROS otherwise negative     Objective:   Physical Exam  Alert and oriented, vitals reviewed and stable, NAD ENT-TM's and ext canals WNL bilat via  otoscopic exam Soft palate, tonsils and post pharynx WNL via oropharyngeal exam Neck-symmetric, no masses; thyroid nonpalpable and nontender Pulmonary-no tachypnea or accessory muscle use; Clear without wheezes via auscultation Card--no abnrml murmurs, rhythm reg and rate WNL Carotid pulses symmetric, without bruits       Assessment & Plan:  Impression type 2 diabetes.  A1c better.  Handling meds well.  Mostly watching diet.  To maintain same management  2.  Hypertension good control discussed blood pressure good on repeat compliance discussed and encouraged salt intake discussed  3.  Hyperlipidemia prior blood work reviewed maintain same appropriate blood work ordered.  Flu shot already given.  Meds refilled further recommendations based on blood work  Follow-up with [facility] 6 months

## 2018-04-29 DIAGNOSIS — I10 Essential (primary) hypertension: Secondary | ICD-10-CM | POA: Diagnosis not present

## 2018-04-29 DIAGNOSIS — E785 Hyperlipidemia, unspecified: Secondary | ICD-10-CM | POA: Diagnosis not present

## 2018-04-30 ENCOUNTER — Encounter: Payer: Self-pay | Admitting: Family Medicine

## 2018-04-30 LAB — HEPATIC FUNCTION PANEL
ALK PHOS: 68 IU/L (ref 39–117)
ALT: 21 IU/L (ref 0–44)
AST: 13 IU/L (ref 0–40)
Albumin: 4.4 g/dL (ref 3.6–4.8)
BILIRUBIN TOTAL: 0.3 mg/dL (ref 0.0–1.2)
BILIRUBIN, DIRECT: 0.09 mg/dL (ref 0.00–0.40)
Total Protein: 6.8 g/dL (ref 6.0–8.5)

## 2018-04-30 LAB — LIPID PANEL
CHOL/HDL RATIO: 4 ratio (ref 0.0–5.0)
CHOLESTEROL TOTAL: 182 mg/dL (ref 100–199)
HDL: 45 mg/dL (ref >39)
LDL Calculated: 97 mg/dL (ref 0–99)
TRIGLYCERIDES: 202 mg/dL — AB (ref 0–149)
VLDL Cholesterol Cal: 40 mg/dL (ref 5–40)

## 2018-07-25 ENCOUNTER — Other Ambulatory Visit: Payer: Self-pay | Admitting: Family Medicine

## 2018-10-06 ENCOUNTER — Other Ambulatory Visit: Payer: Self-pay

## 2018-10-06 ENCOUNTER — Encounter: Payer: Self-pay | Admitting: Family Medicine

## 2018-10-06 ENCOUNTER — Ambulatory Visit (INDEPENDENT_AMBULATORY_CARE_PROVIDER_SITE_OTHER): Payer: BLUE CROSS/BLUE SHIELD | Admitting: Family Medicine

## 2018-10-06 VITALS — BP 132/78 | Temp 98.1°F | Ht 66.0 in | Wt 206.6 lb

## 2018-10-06 DIAGNOSIS — I10 Essential (primary) hypertension: Secondary | ICD-10-CM

## 2018-10-06 DIAGNOSIS — E785 Hyperlipidemia, unspecified: Secondary | ICD-10-CM

## 2018-10-06 DIAGNOSIS — Z125 Encounter for screening for malignant neoplasm of prostate: Secondary | ICD-10-CM

## 2018-10-06 DIAGNOSIS — E119 Type 2 diabetes mellitus without complications: Secondary | ICD-10-CM

## 2018-10-06 DIAGNOSIS — Z79899 Other long term (current) drug therapy: Secondary | ICD-10-CM

## 2018-10-06 LAB — POCT GLYCOSYLATED HEMOGLOBIN (HGB A1C): Hemoglobin A1C: 6.7 % — AB (ref 4.0–5.6)

## 2018-10-06 MED ORDER — GLIPIZIDE 5 MG PO TABS: 10.0000 mg | ORAL_TABLET | Freq: Two times a day (BID) | ORAL | 1 refills | Status: DC

## 2018-10-06 MED ORDER — LOSARTAN POTASSIUM 100 MG PO TABS: 100.0000 mg | ORAL_TABLET | Freq: Every day | ORAL | 1 refills | Status: DC

## 2018-10-06 MED ORDER — TAMSULOSIN HCL 0.4 MG PO CAPS: 0.4000 mg | ORAL_CAPSULE | Freq: Every day | ORAL | 1 refills | Status: DC

## 2018-10-06 MED ORDER — DAPAGLIFLOZIN PROPANEDIOL 10 MG PO TABS: 10.0000 mg | ORAL_TABLET | Freq: Every day | ORAL | 1 refills | Status: DC

## 2018-10-06 MED ORDER — SIMVASTATIN 40 MG PO TABS: 40.0000 mg | ORAL_TABLET | Freq: Every day | ORAL | 1 refills | Status: DC

## 2018-10-06 MED ORDER — METFORMIN HCL 1000 MG PO TABS: 1000.0000 mg | ORAL_TABLET | Freq: Two times a day (BID) | ORAL | 1 refills | Status: DC

## 2018-10-06 MED ORDER — PANTOPRAZOLE SODIUM 40 MG PO TBEC: 40.0000 mg | DELAYED_RELEASE_TABLET | Freq: Every day | ORAL | 1 refills | Status: DC

## 2018-10-06 NOTE — Progress Notes (Signed)
   Subjective:    Patient ID: Tristan Mccarty, male    DOB: 1955/06/19, 63 y.o.   MRN: [id number] Patient arrives with numerous concerns Diabetes  He presents for his follow-up diabetic visit. He has type 2 diabetes mellitus. Risk factors for coronary artery disease include diabetes mellitus, dyslipidemia and hypertension. Current diabetic treatment includes oral agent (triple therapy). He is compliant with treatment all of the time. His weight is stable. He is following a diabetic diet.   Patient states that he has noticed problems in his flow and didn't feel like he was able to empty his bladder like he used to and started taking saw palmetto but that has been giving him yeast infections.   Results for orders placed or performed in visit on 10/06/18  POCT glycosylated hemoglobin (Hb A1C)  Result Value Ref Range   Hemoglobin A1C 6.7 (A) 4.0 - 5.6 %   HbA1c POC (<> result, manual entry)     HbA1c, POC (prediabetic range)     HbA1c, POC (controlled diabetic range)      Blood pressure medicine and blood pressure levels reviewed today with patient. Compliant with blood pressure medicine. States does not miss a dose. No obvious side effects. Blood pressure generally good when checked elsewhere. Watching salt intake.   Patient continues to take lipid medication regularly. No obvious side effects from it. Generally does not miss a dose. Prior blood work results are reviewed with patient. Patient continues to work on fat intake in diet  diab  Patient claims compliance with diabetes medication. No obvious side effects. Reports no substantial low sugar spells. Most numbers are generally in good range when checked fasting. Generally does not miss a dose of medication. Watching diabetic diet closely  Pt has whitish discharge and irrit and uncomfortable  Uses  Antifungal    Review of Systems No headache, no major weight loss or weight gain, no chest pain no back pain abdominal pain no change in  bowel habits complete ROS otherwise negative     Objective:   Physical Exam  Alert and oriented, vitals reviewed and stable, NAD ENT-TM's and ext canals WNL bilat via otoscopic exam Soft palate, tonsils and post pharynx WNL via oropharyngeal exam Neck-symmetric, no masses; thyroid nonpalpable and nontender Pulmonary-no tachypnea or accessory muscle use; Clear without wheezes via auscultation Card--no abnrml murmurs, rhythm reg and rate WNL Carotid pulses symmetric, without bruits       Assessment & Plan:  Impression urinary frequency hesitancy likely secondary to enlarging prostate gland.  Discussed.  Will give trial of Flomax.  Also recurrent urethral yeast infections likely secondary to the [place].  Discussed  2.  Type 2 diabetes good control patient wishes to stay on current medications.  Diet discussed  3.  Hypertension.  Good control compliance discussed maintain same meds  4.  Hyperlipidemia status uncertain  Appropriate blood work recheck in 6 months [facility]

## 2018-10-10 ENCOUNTER — Encounter: Payer: BLUE CROSS/BLUE SHIELD | Admitting: Family Medicine

## 2018-11-27 DIAGNOSIS — Z79899 Other long term (current) drug therapy: Secondary | ICD-10-CM | POA: Diagnosis not present

## 2018-11-27 DIAGNOSIS — E119 Type 2 diabetes mellitus without complications: Secondary | ICD-10-CM | POA: Diagnosis not present

## 2018-11-27 DIAGNOSIS — I10 Essential (primary) hypertension: Secondary | ICD-10-CM | POA: Diagnosis not present

## 2018-11-27 DIAGNOSIS — E785 Hyperlipidemia, unspecified: Secondary | ICD-10-CM | POA: Diagnosis not present

## 2018-11-28 LAB — LIPID PANEL
Chol/HDL Ratio: 3.6 ratio (ref 0.0–5.0)
Cholesterol, Total: 149 mg/dL (ref 100–199)
HDL: 41 mg/dL (ref >39)
LDL Calculated: 77 mg/dL (ref 0–99)
Triglycerides: 153 mg/dL — ABNORMAL HIGH (ref 0–149)
VLDL Cholesterol Cal: 31 mg/dL (ref 5–40)

## 2018-11-28 LAB — BASIC METABOLIC PANEL
BUN/Creatinine Ratio: 12 (ref 10–24)
BUN: 11 mg/dL (ref 8–27)
CO2: 18 mmol/L — ABNORMAL LOW (ref 20–29)
Calcium: 9.1 mg/dL (ref 8.6–10.2)
Chloride: 104 mmol/L (ref 96–106)
Creatinine, Ser: 0.89 mg/dL (ref 0.76–1.27)
GFR calc Af Amer: 107 mL/min/{1.73_m2} (ref >59)
GFR calc non Af Amer: 93 mL/min/{1.73_m2} (ref >59)
Glucose: 175 mg/dL — ABNORMAL HIGH (ref 65–99)
Potassium: 4.8 mmol/L (ref 3.5–5.2)
Sodium: 140 mmol/L (ref 134–144)

## 2018-11-28 LAB — HEPATIC FUNCTION PANEL
ALT: 15 IU/L (ref 0–44)
AST: 16 IU/L (ref 0–40)
Albumin: 4.6 g/dL (ref 3.8–4.9)
Alkaline Phosphatase: 61 IU/L (ref 39–117)
Bilirubin Total: 0.4 mg/dL (ref 0.0–1.2)
Bilirubin, Direct: 0.1 mg/dL (ref 0.00–0.40)
Total Protein: 6.9 g/dL (ref 6.0–8.5)

## 2018-11-28 LAB — PSA: Prostate Specific Ag, Serum: 2.1 ng/mL (ref 0.0–4.0)

## 2018-12-04 ENCOUNTER — Encounter: Payer: Self-pay | Admitting: Family Medicine

## 2018-12-13 ENCOUNTER — Other Ambulatory Visit: Payer: Self-pay

## 2018-12-13 DIAGNOSIS — R6889 Other general symptoms and signs: Secondary | ICD-10-CM | POA: Diagnosis not present

## 2018-12-13 DIAGNOSIS — Z20822 Contact with and (suspected) exposure to covid-19: Secondary | ICD-10-CM

## 2018-12-15 LAB — NOVEL CORONAVIRUS, NAA: SARS-CoV-2, NAA: NOT DETECTED

## 2019-01-14 ENCOUNTER — Other Ambulatory Visit: Payer: Self-pay | Admitting: Family Medicine

## 2019-01-21 ENCOUNTER — Other Ambulatory Visit: Payer: Self-pay | Admitting: Family Medicine

## 2019-04-09 ENCOUNTER — Encounter: Payer: Self-pay | Admitting: Family Medicine

## 2019-04-09 ENCOUNTER — Ambulatory Visit (INDEPENDENT_AMBULATORY_CARE_PROVIDER_SITE_OTHER): Payer: BC Managed Care – PPO | Admitting: Family Medicine

## 2019-04-09 ENCOUNTER — Other Ambulatory Visit: Payer: Self-pay

## 2019-04-09 VITALS — BP 144/90 | Temp 97.8°F | Ht 66.0 in | Wt 194.8 lb

## 2019-04-09 DIAGNOSIS — I10 Essential (primary) hypertension: Secondary | ICD-10-CM | POA: Diagnosis not present

## 2019-04-09 DIAGNOSIS — Z23 Encounter for immunization: Secondary | ICD-10-CM

## 2019-04-09 DIAGNOSIS — E785 Hyperlipidemia, unspecified: Secondary | ICD-10-CM

## 2019-04-09 DIAGNOSIS — Z0000 Encounter for general adult medical examination without abnormal findings: Secondary | ICD-10-CM

## 2019-04-09 DIAGNOSIS — E119 Type 2 diabetes mellitus without complications: Secondary | ICD-10-CM | POA: Diagnosis not present

## 2019-04-09 LAB — POCT GLYCOSYLATED HEMOGLOBIN (HGB A1C): Hemoglobin A1C: 6.7 % — AB (ref 4.0–5.6)

## 2019-04-09 MED ORDER — SIMVASTATIN 40 MG PO TABS: 40.0000 mg | ORAL_TABLET | Freq: Every day | ORAL | 1 refills | Status: DC

## 2019-04-09 MED ORDER — GLIPIZIDE 5 MG PO TABS: 10.0000 mg | ORAL_TABLET | Freq: Two times a day (BID) | ORAL | 1 refills | Status: DC

## 2019-04-09 MED ORDER — METFORMIN HCL 1000 MG PO TABS: 1000.0000 mg | ORAL_TABLET | Freq: Two times a day (BID) | ORAL | 1 refills | Status: DC

## 2019-04-09 MED ORDER — FARXIGA 10 MG PO TABS: 10.0000 mg | ORAL_TABLET | Freq: Every day | ORAL | 1 refills | Status: DC

## 2019-04-09 MED ORDER — LOSARTAN POTASSIUM 100 MG PO TABS: 100.0000 mg | ORAL_TABLET | Freq: Every day | ORAL | 1 refills | Status: DC

## 2019-04-09 MED ORDER — TAMSULOSIN HCL 0.4 MG PO CAPS: 0.4000 mg | ORAL_CAPSULE | Freq: Every day | ORAL | 1 refills | Status: DC

## 2019-04-09 MED ORDER — PANTOPRAZOLE SODIUM 40 MG PO TBEC: 40.0000 mg | DELAYED_RELEASE_TABLET | Freq: Every day | ORAL | 1 refills | Status: DC

## 2019-04-09 NOTE — Progress Notes (Signed)
Subjective:    Patient ID: Tristan Mccarty, male    DOB: 02-Aug-1955, 63 y.o.   MRN: [id number]  HPI The patient comes in today for a wellness visit.    A review of their health history was completed.  A review of medications was also completed.  Any needed refills; tamsulosin  Eating habits: health conscious  Falls/  MVA accidents in past few months: none  Regular exercise: no regular exercise just working [employer] pt sees on regular basis: none  Preventative health issues were discussed.   Additional concerns: none  Results for orders placed or performed in visit on 04/09/19  POCT HgB A1C  Result Value Ref Range   Hemoglobin A1C 6.7 (A) 4.0 - 5.6 %   HbA1c POC (<> result, manual entry)     HbA1c, POC (prediabetic range)     HbA1c, POC (controlled diabetic range)     Recent Results (from the past 2160 hour(s))  POCT HgB A1C     Status: Abnormal   Collection Time: 04/09/19  2:38 PM  Result Value Ref Range   Hemoglobin A1C 6.7 (A) 4.0 - 5.6 %   HbA1c POC (<> result, manual entry)     HbA1c, POC (prediabetic range)     HbA1c, POC (controlled diabetic range)     Patient claims compliance with diabetes medication. No obvious side effects. Reports no substantial low sugar spells. Most numbers are generally in good range when checked fasting. Generally does not miss a dose of medication. Watching diabetic diet closely  Blood pressure medicine and blood pressure levels reviewed today with patient. Compliant with blood pressure medicine. States does not miss a dose. No obvious side effects. Blood pressure generally good when checked elsewhere. Watching salt intake.  Top numb not sig hig usuall y 120 syst   Patient continues to take lipid medication regularly. No obvious side effects from it. Generally does not miss a dose. Prior blood work results are reviewed with patient. Patient continues to work on fat intake in diet    Review of Systems  Constitutional:  Negative for activity change, appetite change and fever.  HENT: Negative for congestion and rhinorrhea.   Eyes: Negative for discharge.  Respiratory: Negative for cough and wheezing.   Cardiovascular: Negative for chest pain.  Gastrointestinal: Negative for abdominal pain, blood in stool and vomiting.  Genitourinary: Negative for difficulty urinating and frequency.  Musculoskeletal: Negative for neck pain.  Skin: Negative for rash.  Allergic/Immunologic: Negative for environmental allergies and food allergies.  Neurological: Negative for weakness and headaches.  Psychiatric/Behavioral: Negative for agitation.  All other systems reviewed and are negative.      Objective:   Physical Exam Vitals Mccarty reviewed.  Constitutional:      Appearance: He is well-developed.  HENT:     Head: Normocephalic and atraumatic.     Right Ear: External ear normal.     Left Ear: External ear normal.     Nose: Nose normal.  Eyes:     Pupils: Pupils are equal, round, and reactive to light.  Neck:     Musculoskeletal: Normal range of motion and neck supple.     Thyroid: No thyromegaly.  Cardiovascular:     Rate and Rhythm: Normal rate and regular rhythm.     Heart sounds: Normal heart sounds. No murmur.  Pulmonary:     Effort: Pulmonary effort is normal. No respiratory distress.     Breath sounds: Normal breath sounds. No wheezing.  Abdominal:     General: Bowel sounds are normal. There is no distension.     Palpations: Abdomen is soft. There is no mass.     Tenderness: There is no abdominal tenderness.  Genitourinary:    Penis: Normal.   Musculoskeletal: Normal range of motion.  Lymphadenopathy:     Cervical: No cervical adenopathy.  Skin:    General: Skin is warm and dry.     Findings: No erythema.  Neurological:     Mental Status: He is alert.     Motor: No abnormal muscle tone.  Psychiatric:        Behavior: Behavior normal.        Judgment: Judgment normal.            Assessment & Plan:  Impression wellness exam diet discussed.  Exercise discussed.  Colonoscopy discussed.  Lab work reviewed.  2.  Type 2 diabetes.  Overall good control discussed maintain the same meds diet exercise  3.  Hypertension blood pressure improved on repeat compliance discussed salt intake discussed  Flu shot today.  Medications refilled follow-up in 6 months

## 2019-06-16 ENCOUNTER — Other Ambulatory Visit: Payer: Self-pay | Admitting: Family Medicine

## 2019-06-18 ENCOUNTER — Encounter: Payer: Self-pay | Admitting: Gastroenterology

## 2019-06-21 ENCOUNTER — Encounter: Payer: Self-pay | Admitting: Family Medicine

## 2019-09-20 DIAGNOSIS — Z23 Encounter for immunization: Secondary | ICD-10-CM | POA: Diagnosis not present

## 2019-10-08 ENCOUNTER — Telehealth (INDEPENDENT_AMBULATORY_CARE_PROVIDER_SITE_OTHER): Payer: BC Managed Care – PPO | Admitting: Family Medicine

## 2019-10-08 ENCOUNTER — Other Ambulatory Visit: Payer: Self-pay

## 2019-10-08 ENCOUNTER — Telehealth: Payer: Self-pay | Admitting: *Deleted

## 2019-10-08 DIAGNOSIS — I10 Essential (primary) hypertension: Secondary | ICD-10-CM | POA: Diagnosis not present

## 2019-10-08 DIAGNOSIS — E119 Type 2 diabetes mellitus without complications: Secondary | ICD-10-CM | POA: Diagnosis not present

## 2019-10-08 DIAGNOSIS — E785 Hyperlipidemia, unspecified: Secondary | ICD-10-CM

## 2019-10-08 DIAGNOSIS — Z125 Encounter for screening for malignant neoplasm of prostate: Secondary | ICD-10-CM

## 2019-10-08 DIAGNOSIS — Z79899 Other long term (current) drug therapy: Secondary | ICD-10-CM | POA: Diagnosis not present

## 2019-10-08 MED ORDER — METFORMIN HCL 1000 MG PO TABS: 1000.0000 mg | ORAL_TABLET | Freq: Two times a day (BID) | ORAL | 1 refills | Status: DC

## 2019-10-08 MED ORDER — AMLODIPINE BESYLATE 5 MG PO TABS: 5.0000 mg | ORAL_TABLET | Freq: Every day | ORAL | 1 refills | Status: DC

## 2019-10-08 MED ORDER — DAPAGLIFLOZIN PROPANEDIOL 10 MG PO TABS: 10.0000 mg | ORAL_TABLET | Freq: Every day | ORAL | 1 refills | Status: DC

## 2019-10-08 MED ORDER — SIMVASTATIN 40 MG PO TABS: 40.0000 mg | ORAL_TABLET | Freq: Every day | ORAL | 1 refills | Status: DC

## 2019-10-08 MED ORDER — GLIPIZIDE 5 MG PO TABS: 10.0000 mg | ORAL_TABLET | Freq: Two times a day (BID) | ORAL | 1 refills | Status: DC

## 2019-10-08 MED ORDER — LOSARTAN POTASSIUM 100 MG PO TABS: 100.0000 mg | ORAL_TABLET | Freq: Every day | ORAL | 1 refills | Status: DC

## 2019-10-08 MED ORDER — TAMSULOSIN HCL 0.4 MG PO CAPS: 0.4000 mg | ORAL_CAPSULE | Freq: Every day | ORAL | 1 refills | Status: DC

## 2019-10-08 MED ORDER — PANTOPRAZOLE SODIUM 40 MG PO TBEC: 40.0000 mg | DELAYED_RELEASE_TABLET | Freq: Every day | ORAL | 1 refills | Status: DC

## 2019-10-08 MED ORDER — ONETOUCH ULTRA VI STRP: ORAL_STRIP | 0 refills | Status: DC

## 2019-10-08 NOTE — Telephone Encounter (Signed)
[name], brister are scheduled for a virtual visit with your provider today.    Just as we do with appointments in the office, we must obtain your consent to participate.  Your consent will be active for this visit and any virtual visit you may have with one of our providers in the next 365 days.    If you have a MyChart account, I can also send a copy of this consent to you electronically.  All virtual visits are billed to your insurance company just like a traditional visit in the office.  As this is a virtual visit, video technology does not allow for your provider to perform a traditional examination.  This may limit your provider's ability to fully assess your condition.  If your provider identifies any concerns that need to be evaluated in person or the need to arrange testing such as labs, EKG, etc, we will make arrangements to do so.    Although advances in technology are sophisticated, we cannot ensure that it will always work on either your end or our end.  If the connection with a video visit is poor, we may have to switch to a telephone visit.  With either a video or telephone visit, we are not always able to ensure that we have a secure connection.   I need to obtain your verbal consent now.   Are you willing to proceed with your visit today?   Tristan Mccarty has provided verbal consent on 10/08/2019 for a virtual visit (video or telephone).   Patsy Lager, LPN [id number]  [id number] AM

## 2019-10-08 NOTE — Progress Notes (Signed)
   Subjective:  Audio only  Patient ID: Tristan Mccarty, male    DOB: 1955-07-02, 64 y.o.   MRN: [id number]  Diabetes He presents for his follow-up diabetic visit. He has type 2 diabetes mellitus. Current diabetic treatments: Glipizide. He is compliant with treatment all of the time. Diabetic current diet: Could be better.  He does not see a podiatrist.Eye exam is current.  Hypertension This is a chronic problem. Condition status: patient reports checking BP about 4 x per week and it typically runs around 150/90. Dealing with a lot of stress at home.  Treatments tried: Losartan. Compliance problems: diet could be better.       Review of Systems    Virtual Visit via Video Note  I connected with Tristan Mccarty on 10/08/19 at  1:10 PM EDT by a video enabled telemedicine application and verified that I am speaking with the correct person using two identifiers.  Location: Patient: home Provider: office   I discussed the limitations of evaluation and management by telemedicine and the availability of in person appointments. The patient expressed understanding and agreed to proceed.  History of Present Illness:    Observations/Objective:   Assessment and Plan:   Follow Up Instructions:    I discussed the assessment and treatment plan with the patient. The patient was provided an opportunity to ask questions and all were answered. The patient agreed with the plan and demonstrated an understanding of the instructions.   The patient was advised to call back or seek an in-person evaluation if the symptoms worsen or if the condition fails to improve as anticipated.  I provided 22 minutes of non-face-to-face time during this encounter.  Objective:   Physical Exam  Virtual      Assessment & Plan:  Impression 1 hypertension.  Suboptimal control discussed adding Norvasc.  Rationale discussed  2.  Type 2 diabetes glucose is a little high await A1c  3.  Hyperlipidemia status  uncertain  Appropriate blood work medications refilled.  Add Norvasc.  Follow-up in 6 months diet exercise discussed

## 2019-10-09 DIAGNOSIS — Z23 Encounter for immunization: Secondary | ICD-10-CM | POA: Diagnosis not present

## 2020-01-07 DIAGNOSIS — Z79899 Other long term (current) drug therapy: Secondary | ICD-10-CM | POA: Diagnosis not present

## 2020-01-07 DIAGNOSIS — Z125 Encounter for screening for malignant neoplasm of prostate: Secondary | ICD-10-CM | POA: Diagnosis not present

## 2020-01-07 DIAGNOSIS — E785 Hyperlipidemia, unspecified: Secondary | ICD-10-CM | POA: Diagnosis not present

## 2020-01-07 DIAGNOSIS — I10 Essential (primary) hypertension: Secondary | ICD-10-CM | POA: Diagnosis not present

## 2020-01-07 DIAGNOSIS — E119 Type 2 diabetes mellitus without complications: Secondary | ICD-10-CM | POA: Diagnosis not present

## 2020-01-08 LAB — BASIC METABOLIC PANEL
BUN/Creatinine Ratio: 12 (ref 10–24)
BUN: 10 mg/dL (ref 8–27)
CO2: 22 mmol/L (ref 20–29)
Calcium: 9.3 mg/dL (ref 8.6–10.2)
Chloride: 103 mmol/L (ref 96–106)
Creatinine, Ser: 0.83 mg/dL (ref 0.76–1.27)
GFR calc Af Amer: 109 mL/min/{1.73_m2} (ref >59)
GFR calc non Af Amer: 94 mL/min/{1.73_m2} (ref >59)
Glucose: 151 mg/dL — ABNORMAL HIGH (ref 65–99)
Potassium: 4.5 mmol/L (ref 3.5–5.2)
Sodium: 141 mmol/L (ref 134–144)

## 2020-01-08 LAB — LIPID PANEL
Chol/HDL Ratio: 3.3 ratio (ref 0.0–5.0)
Cholesterol, Total: 145 mg/dL (ref 100–199)
HDL: 44 mg/dL (ref >39)
LDL Chol Calc (NIH): 79 mg/dL (ref 0–99)
Triglycerides: 122 mg/dL (ref 0–149)
VLDL Cholesterol Cal: 22 mg/dL (ref 5–40)

## 2020-01-08 LAB — HEMOGLOBIN A1C
Est. average glucose Bld gHb Est-mCnc: 177 mg/dL
Hgb A1c MFr Bld: 7.8 % — ABNORMAL HIGH (ref 4.8–5.6)

## 2020-01-08 LAB — MICROALBUMIN / CREATININE URINE RATIO
Creatinine, Urine: 55.4 mg/dL
Microalb/Creat Ratio: <5 mg/g creat (ref 0–29)
Microalbumin, Urine: <3 ug/mL

## 2020-01-08 LAB — HEPATIC FUNCTION PANEL
ALT: 15 IU/L (ref 0–44)
AST: 16 IU/L (ref 0–40)
Albumin: 4.7 g/dL (ref 3.8–4.8)
Alkaline Phosphatase: 59 IU/L (ref 48–121)
Bilirubin Total: 0.3 mg/dL (ref 0.0–1.2)
Bilirubin, Direct: 0.11 mg/dL (ref 0.00–0.40)
Total Protein: 6.9 g/dL (ref 6.0–8.5)

## 2020-01-08 LAB — PSA: Prostate Specific Ag, Serum: 2.4 ng/mL (ref 0.0–4.0)

## 2020-02-11 ENCOUNTER — Other Ambulatory Visit: Payer: Self-pay

## 2020-02-11 ENCOUNTER — Ambulatory Visit (INDEPENDENT_AMBULATORY_CARE_PROVIDER_SITE_OTHER): Payer: BC Managed Care – PPO | Admitting: Family Medicine

## 2020-02-11 ENCOUNTER — Encounter: Payer: Self-pay | Admitting: Family Medicine

## 2020-02-11 VITALS — BP 134/84 | HR 74 | Temp 97.7°F | Ht 66.0 in | Wt 193.0 lb

## 2020-02-11 DIAGNOSIS — E119 Type 2 diabetes mellitus without complications: Secondary | ICD-10-CM | POA: Diagnosis not present

## 2020-02-11 DIAGNOSIS — I10 Essential (primary) hypertension: Secondary | ICD-10-CM

## 2020-02-11 DIAGNOSIS — Z23 Encounter for immunization: Secondary | ICD-10-CM | POA: Diagnosis not present

## 2020-02-11 DIAGNOSIS — E782 Mixed hyperlipidemia: Secondary | ICD-10-CM

## 2020-02-11 MED ORDER — GLIPIZIDE 5 MG PO TABS: 10.0000 mg | ORAL_TABLET | Freq: Two times a day (BID) | ORAL | 1 refills | Status: DC

## 2020-02-11 MED ORDER — SIMVASTATIN 40 MG PO TABS
40.0000 mg | ORAL_TABLET | Freq: Every day | ORAL | 1 refills | Status: DC
Start: 2020-02-11 — End: 2020-10-14

## 2020-02-11 MED ORDER — DAPAGLIFLOZIN PROPANEDIOL 10 MG PO TABS: 10.0000 mg | ORAL_TABLET | Freq: Every day | ORAL | 1 refills | Status: DC

## 2020-02-11 MED ORDER — METFORMIN HCL 1000 MG PO TABS
1000.0000 mg | ORAL_TABLET | Freq: Two times a day (BID) | ORAL | 1 refills | Status: DC
Start: 2020-02-11 — End: 2020-10-14

## 2020-02-11 MED ORDER — PANTOPRAZOLE SODIUM 40 MG PO TBEC
40.0000 mg | DELAYED_RELEASE_TABLET | Freq: Every day | ORAL | 1 refills | Status: DC
Start: 2020-02-11 — End: 2020-10-14

## 2020-02-11 MED ORDER — TAMSULOSIN HCL 0.4 MG PO CAPS
0.4000 mg | ORAL_CAPSULE | Freq: Every day | ORAL | 1 refills | Status: DC
Start: 2020-02-11 — End: 2020-08-04

## 2020-02-11 MED ORDER — LOSARTAN POTASSIUM 100 MG PO TABS: 100.0000 mg | ORAL_TABLET | Freq: Every day | ORAL | 1 refills | Status: DC

## 2020-02-11 MED ORDER — AMLODIPINE BESYLATE 5 MG PO TABS: 5.0000 mg | ORAL_TABLET | Freq: Every day | ORAL | 1 refills | Status: DC

## 2020-02-11 NOTE — Progress Notes (Signed)
Patient ID: Tristan Mccarty, male    DOB: 1955-08-08, 64 y.o.   MRN: [id number]   Chief Complaint  Patient presents with  . Hypertension   Subjective:    HPI Pt here to establish care and go over blood work. No issues or concerns at the moment.   a1c inc to 7.6, last year at 6.7. Having more stress. And has been increase in eating some times. Wife sick and lost ability to walk and seen by neuro. Wife dealing with "pres syndrome."  DM2- taking farxiga, glipoide, and metformin. htn- amlodipine and losartan.  gerd- taking it 1x every 2 months. Not taking daily, just taking protonix prn.  No sores on feet. Has eye exam tomorrow. Pt over due for colonoscopy.  Medical History Tristan Mccarty has a past medical history of Hyperlipidemia, Hypertension, Reflux, and Type 2 diabetes mellitus ([place]).   Outpatient Encounter Medications as of 02/11/2020  Medication Sig  . amLODipine (NORVASC) 5 MG tablet Take 1 tablet (5 mg total) by mouth daily.  Marland Kitchen aspirin 81 MG tablet Take 81 mg by mouth. Take one BID  . dapagliflozin propanediol (FARXIGA) 10 MG TABS tablet Take 1 tablet (10 mg total) by mouth daily.  Marland Kitchen glipiZIDE (GLUCOTROL) 5 MG tablet Take 2 tablets (10 mg total) by mouth 2 (two) times daily before a meal.  . glucose blood (ONETOUCH ULTRA) test strip USE TO TEST BLOOD SUGAR DAILY  . Ibuprofen-Diphenhydramine Cit (IBUPROFEN PM) 200-38 MG TABS Take by mouth. Take as needed for sleep  . ketoconazole (NIZORAL) 2 % cream Apply 1 application topically 2 (two) times daily.  Marland Kitchen losartan (COZAAR) 100 MG tablet Take 1 tablet (100 mg total) by mouth daily.  . metFORMIN (GLUCOPHAGE) 1000 MG tablet Take 1 tablet (1,000 mg total) by mouth 2 (two) times daily.  . pantoprazole (PROTONIX) 40 MG tablet Take 1 tablet (40 mg total) by mouth daily.  . simvastatin (ZOCOR) 40 MG tablet Take 1 tablet (40 mg total) by mouth daily.  . tamsulosin (FLOMAX) 0.4 MG CAPS capsule Take 1 capsule (0.4 mg total) by mouth  daily.  . [DISCONTINUED] amLODipine (NORVASC) 5 MG tablet Take 1 tablet (5 mg total) by mouth daily.  . [DISCONTINUED] dapagliflozin propanediol (FARXIGA) 10 MG TABS tablet Take 1 tablet (10 mg total) by mouth daily.  . [DISCONTINUED] glipiZIDE (GLUCOTROL) 5 MG tablet Take 2 tablets (10 mg total) by mouth 2 (two) times daily before a meal.  . [DISCONTINUED] losartan (COZAAR) 100 MG tablet Take 1 tablet (100 mg total) by mouth daily.  . [DISCONTINUED] metFORMIN (GLUCOPHAGE) 1000 MG tablet Take 1 tablet (1,000 mg total) by mouth 2 (two) times daily.  . [DISCONTINUED] pantoprazole (PROTONIX) 40 MG tablet Take 1 tablet (40 mg total) by mouth daily.  . [DISCONTINUED] simvastatin (ZOCOR) 40 MG tablet Take 1 tablet (40 mg total) by mouth daily.  . [DISCONTINUED] tamsulosin (FLOMAX) 0.4 MG CAPS capsule Take 1 capsule (0.4 mg total) by mouth daily.   No facility-administered encounter medications on file as of 02/11/2020.     Review of Systems  Constitutional: Negative for chills and fever.  HENT: Negative for congestion, rhinorrhea and sore throat.   Respiratory: Negative for cough, shortness of breath and wheezing.   Cardiovascular: Negative for chest pain and leg swelling.  Gastrointestinal: Negative for abdominal pain, diarrhea, nausea and vomiting.  Genitourinary: Negative for dysuria and frequency.  Skin: Negative for rash.  Neurological: Negative for dizziness, weakness and headaches.     Vitals  BP 134/84   Pulse 74   Temp 97.7 F (36.5 C)   Ht 5\' 6"  (1.676 m)   Wt 193 lb (87.5 kg)   SpO2 99%   BMI 31.15 kg/m   Objective:   Physical Exam Vitals and nursing note reviewed.  Constitutional:      General: He is not in acute distress.    Appearance: Normal appearance. He is not ill-appearing.  HENT:     Head: Normocephalic.     Nose: Nose normal. No congestion.     Mouth/Throat:     Mouth: Mucous membranes are moist.     Pharynx: No oropharyngeal exudate.  Eyes:      Extraocular Movements: Extraocular movements intact.     Conjunctiva/sclera: Conjunctivae normal.     Pupils: Pupils are equal, round, and reactive to light.  Cardiovascular:     Rate and Rhythm: Normal rate and regular rhythm.     Pulses: Normal pulses.     Heart sounds: Normal heart sounds. No murmur heard.   Pulmonary:     Effort: Pulmonary effort is normal.     Breath sounds: Normal breath sounds. No wheezing, rhonchi or rales.  Musculoskeletal:        General: Normal range of motion.     Right lower leg: No edema.     Left lower leg: No edema.  Skin:    General: Skin is warm and dry.     Findings: No rash.  Neurological:     General: No focal deficit present.     Mental Status: He is alert and oriented to person, place, and time.     Cranial Nerves: No cranial nerve deficit.  Psychiatric:        Mood and Affect: Mood normal.        Behavior: Behavior normal.        Thought Content: Thought content normal.        Judgment: Judgment normal.      Assessment and Plan   1. Type 2 diabetes mellitus without complication, without long-term current use of insulin ([place])  2. Need for vaccination  3. Essential hypertension, benign  4. Mixed hyperlipidemia   DM2- worsening, a1c  increased from 6.7 to 7.8 this time.  Has not been watching his diet. Advising to dec carb and sweets and cont meds.  Cont with glipizide nd metformin.  Cont to check bg 2x per day. May need to increase meds on next visit if higher a1c than 7.8.  htn- suboptimal.  Cont to monitor and dec salt intake. Cont meds. Recheck on next visit.  HM- ordered flu vaccine, pt had to leave due to emergency.  Will give on next visit. Ordered screening colonoscopy.  HLD- stable, improved.  Cont meds.  F/u 34mo or prn.

## 2020-03-24 ENCOUNTER — Telehealth: Payer: Self-pay

## 2020-03-24 NOTE — Telephone Encounter (Signed)
Pt dropped off form to be completed by Dr Lovena Le placed in folder.  Pt call back [phone]

## 2020-03-26 NOTE — Telephone Encounter (Signed)
done

## 2020-04-14 ENCOUNTER — Other Ambulatory Visit: Payer: Self-pay

## 2020-04-14 ENCOUNTER — Ambulatory Visit (INDEPENDENT_AMBULATORY_CARE_PROVIDER_SITE_OTHER): Payer: BC Managed Care – PPO | Admitting: Family Medicine

## 2020-04-14 ENCOUNTER — Encounter: Payer: Self-pay | Admitting: Family Medicine

## 2020-04-14 VITALS — BP 140/82 | HR 94 | Temp 97.2°F | Wt 190.2 lb

## 2020-04-14 DIAGNOSIS — Z23 Encounter for immunization: Secondary | ICD-10-CM

## 2020-04-14 DIAGNOSIS — Z0000 Encounter for general adult medical examination without abnormal findings: Secondary | ICD-10-CM | POA: Diagnosis not present

## 2020-04-14 DIAGNOSIS — Z135 Encounter for screening for eye and ear disorders: Secondary | ICD-10-CM | POA: Diagnosis not present

## 2020-04-14 DIAGNOSIS — I10 Essential (primary) hypertension: Secondary | ICD-10-CM

## 2020-04-14 DIAGNOSIS — E119 Type 2 diabetes mellitus without complications: Secondary | ICD-10-CM

## 2020-04-14 MED ORDER — ONETOUCH ULTRA VI STRP: 1.0000 | ORAL_STRIP | Freq: Two times a day (BID) | 3 refills | Status: DC

## 2020-04-14 MED ORDER — AMLODIPINE BESYLATE 5 MG PO TABS: 5.0000 mg | ORAL_TABLET | Freq: Two times a day (BID) | ORAL | 1 refills | Status: DC

## 2020-04-14 NOTE — Patient Instructions (Signed)
If seeing numbers under 100/60 or over 150/85 call the office.  For blodo glucose call if seeing numbers under 70 or over 200.

## 2020-04-14 NOTE — Progress Notes (Signed)
Patient ID: Tristan Mccarty, male    DOB: December 20, 1955, 64 y.o.   MRN: [id number]   Chief Complaint  Patient presents with  . Annual Exam   Subjective:    HPI  The patient comes in today for a wellness visit.  A review of their health history was completed.  A review of medications was also completed.  Eating habits: pretty good   Falls/  MVA accidents in past few months: none  Regular exercise: moderate exercise with work  Specialist pt sees on regular basis: none  Preventative health issues were discussed.   Additional concerns: h/o DM2- low blood sugars in AM seeing 74, noticing over last 1-2 wks.  Feeling weakness in body/legs, no sweats.  Had a couple of donuts after going out the door and drinking coffee. Pt is on glipizide 10mg  bid ac and metformin 1000mg  bid with food. And taking farxiga.  Then ate 2 sandwiches to try to help when it gets low.  Taking bp meds at night around 5-7pm.   Medical History Marino has a past medical history of Hyperlipidemia, Hypertension, Reflux, and Type 2 diabetes mellitus ([place]).   Outpatient Encounter Medications as of 04/14/2020  Medication Sig  . amLODipine (NORVASC) 5 MG tablet Take 1 tablet (5 mg total) by mouth 2 (two) times daily.  Marland Kitchen aspirin 81 MG tablet Take 81 mg by mouth. Take one BID  . dapagliflozin propanediol (FARXIGA) 10 MG TABS tablet Take 1 tablet (10 mg total) by mouth daily.  Marland Kitchen glipiZIDE (GLUCOTROL) 5 MG tablet Take 2 tablets (10 mg total) by mouth 2 (two) times daily before a meal.  . glucose blood (ONETOUCH ULTRA) test strip 1 each by Other route 2 (two) times daily. USE TO TEST BLOOD SUGAR DAILY  . Ibuprofen-Diphenhydramine Cit (IBUPROFEN PM) 200-38 MG TABS Take by mouth. Take as needed for sleep  . ketoconazole (NIZORAL) 2 % cream Apply 1 application topically 2 (two) times daily.  Marland Kitchen losartan (COZAAR) 100 MG tablet Take 1 tablet (100 mg total) by mouth daily.  . metFORMIN (GLUCOPHAGE) 1000 MG tablet Take 1  tablet (1,000 mg total) by mouth 2 (two) times daily.  . pantoprazole (PROTONIX) 40 MG tablet Take 1 tablet (40 mg total) by mouth daily.  . simvastatin (ZOCOR) 40 MG tablet Take 1 tablet (40 mg total) by mouth daily.  . tamsulosin (FLOMAX) 0.4 MG CAPS capsule Take 1 capsule (0.4 mg total) by mouth daily.  . [DISCONTINUED] amLODipine (NORVASC) 5 MG tablet Take 1 tablet (5 mg total) by mouth daily.  . [DISCONTINUED] glucose blood (ONETOUCH ULTRA) test strip USE TO TEST BLOOD SUGAR DAILY   No facility-administered encounter medications on file as of 04/14/2020.     Review of Systems  Constitutional: Negative for chills and fever.  HENT: Negative for congestion, rhinorrhea and sore throat.   Respiratory: Negative for cough, shortness of breath and wheezing.   Cardiovascular: Negative for chest pain and leg swelling.  Gastrointestinal: Negative for abdominal pain, diarrhea, nausea and vomiting.  Genitourinary: Negative for dysuria and frequency.  Skin: Negative for rash.  Neurological: Negative for dizziness, weakness and headaches.     Vitals BP 140/82   Pulse 94   Temp (!) 97.2 F (36.2 C)   Wt 190 lb 3.2 oz (86.3 kg)   SpO2 98%   BMI 30.70 kg/m   Objective:   Physical Exam Vitals and nursing note reviewed.  Constitutional:      General: He is not in acute  distress.    Appearance: Normal appearance. He is not ill-appearing.  HENT:     Head: Normocephalic.     Nose: Nose normal. No congestion.     Mouth/Throat:     Mouth: Mucous membranes are moist.     Pharynx: No oropharyngeal exudate.  Eyes:     Extraocular Movements: Extraocular movements intact.     Conjunctiva/sclera: Conjunctivae normal.     Pupils: Pupils are equal, round, and reactive to light.  Cardiovascular:     Rate and Rhythm: Normal rate and regular rhythm.     Pulses: Normal pulses.     Heart sounds: Normal heart sounds. No murmur heard.   Pulmonary:     Effort: Pulmonary effort is normal.      Breath sounds: Normal breath sounds. No wheezing, rhonchi or rales.  Musculoskeletal:        General: Normal range of motion.     Right lower leg: No edema.     Left lower leg: No edema.  Skin:    General: Skin is warm and dry.     Findings: No rash.  Neurological:     General: No focal deficit present.     Mental Status: He is alert and oriented to person, place, and time.     Cranial Nerves: No cranial nerve deficit.  Psychiatric:        Mood and Affect: Mood normal.        Behavior: Behavior normal.        Thought Content: Thought content normal.        Judgment: Judgment normal.     Assessment and Plan   1. Well adult exam  2. Need for vaccination - Flu Vaccine QUAD 6+ mos PF IM (Fluarix Quad PF)  3. Essential hypertension, benign - amLODipine (NORVASC) 5 MG tablet; Take 1 tablet (5 mg total) by mouth 2 (two) times daily.  Dispense: 180 tablet; Refill: 1  4. Type 2 diabetes mellitus without complication, without long-term current use of insulin ([facility]) - glucose blood (ONETOUCH ULTRA) test strip; 1 each by Other route 2 (two) times daily. USE TO TEST BLOOD SUGAR DAILY  Dispense: 100 strip; Refill: 3  5. Screening for diabetic retinopathy - Ambulatory referral to Ophthalmology   HM- needing colonoscopy repeated.  Needs eye exam.  HTN- uncontrolled.  Adding 5mg  norvasc bid, instead of just pm. Cont with losartan 100mg  at night. Getting up at 3am to go to work, working 5 days per week.  DM2- cont to monitor seeing some low bg in 70s. 2x per day for next week for bg.  Usually seeing 150-180s, BG. Advising to make sure to eat a meal when taking his DM meds.  Also to make sure has protein, carbs, and fats.  Not just carbs.  Pt voiced understanding.  Pt to get flu vaccine today.  F/u 52mo or prn.

## 2020-04-30 ENCOUNTER — Encounter: Payer: BC Managed Care – PPO | Admitting: Family Medicine

## 2020-08-03 ENCOUNTER — Other Ambulatory Visit: Payer: Self-pay | Admitting: Family Medicine

## 2020-08-03 DIAGNOSIS — I10 Essential (primary) hypertension: Secondary | ICD-10-CM

## 2020-08-03 DIAGNOSIS — E782 Mixed hyperlipidemia: Secondary | ICD-10-CM

## 2020-08-03 DIAGNOSIS — Z79899 Other long term (current) drug therapy: Secondary | ICD-10-CM

## 2020-08-03 DIAGNOSIS — E119 Type 2 diabetes mellitus without complications: Secondary | ICD-10-CM

## 2020-08-04 NOTE — Telephone Encounter (Signed)
Pt needs labs before next visit- pls order cbc, cmp, lipids, a1c, and urine microalbumin. Thx. Dr. Lovena Le

## 2020-08-04 NOTE — Telephone Encounter (Signed)
Lab orders placed. Sent My chart message to patient.

## 2020-08-04 NOTE — Telephone Encounter (Signed)
Lab Results  Component Value Date   HGBA1C 7.8 (H) 01/07/2020    Lab Results  Component Value Date   CREATININE 0.83 01/07/2020     Lab Results  Component Value Date   CHOL 145 01/07/2020   HDL 44 01/07/2020   LDLCALC 79 01/07/2020   TRIG 122 01/07/2020   CHOLHDL 3.3 01/07/2020     BP Readings from Last 3 Encounters:  04/14/20 140/82  02/11/20 134/84  04/09/19 (!) 144/90

## 2020-08-04 NOTE — Addendum Note (Signed)
Addended by: Vicente Males on: 08/04/2020 04:49 PM   Modules accepted: Orders

## 2020-08-08 ENCOUNTER — Encounter: Payer: Self-pay | Admitting: Nurse Practitioner

## 2020-08-08 ENCOUNTER — Other Ambulatory Visit: Payer: Self-pay

## 2020-08-08 ENCOUNTER — Ambulatory Visit (INDEPENDENT_AMBULATORY_CARE_PROVIDER_SITE_OTHER): Payer: BC Managed Care – PPO | Admitting: Nurse Practitioner

## 2020-08-08 ENCOUNTER — Telehealth: Payer: Self-pay | Admitting: Nurse Practitioner

## 2020-08-08 VITALS — BP 152/90 | HR 92 | Temp 98.4°F

## 2020-08-08 DIAGNOSIS — M549 Dorsalgia, unspecified: Secondary | ICD-10-CM | POA: Diagnosis not present

## 2020-08-08 DIAGNOSIS — R059 Cough, unspecified: Secondary | ICD-10-CM | POA: Diagnosis not present

## 2020-08-08 DIAGNOSIS — M792 Neuralgia and neuritis, unspecified: Secondary | ICD-10-CM | POA: Diagnosis not present

## 2020-08-08 DIAGNOSIS — E119 Type 2 diabetes mellitus without complications: Secondary | ICD-10-CM

## 2020-08-08 MED ORDER — TRIAMCINOLONE ACETONIDE 0.1 % EX CREA: 1.0000 "application " | TOPICAL_CREAM | Freq: Two times a day (BID) | CUTANEOUS | 0 refills | Status: DC

## 2020-08-08 MED ORDER — GABAPENTIN 300 MG PO CAPS: 300.0000 mg | ORAL_CAPSULE | Freq: Two times a day (BID) | ORAL | 0 refills | Status: DC

## 2020-08-08 NOTE — Progress Notes (Signed)
   Subjective:    Patient ID: Tristan Mccarty, male    DOB: 1955/08/26, 65 y.o.   MRN: [id number]  HPI Pt having scratchy throat, cough, sinus 'are packed". Pt states that "it feels like nerves are standing on end on his back". Going on for a couple of months. Has had some life changes go one.    Review of Systems     Objective:   Physical Exam        Assessment & Plan:

## 2020-08-08 NOTE — Telephone Encounter (Signed)
My chart message sent to patient. Call back next week if no improvement. Also COVID testing pending.

## 2020-08-08 NOTE — Telephone Encounter (Signed)
Patient was seen for rash on back but wants to know if you will call in something for sinus infection and sore throat to [facility]

## 2020-08-08 NOTE — Progress Notes (Signed)
Subjective:    Patient ID: Tristan Mccarty, male    DOB: [birth year], 65 y.o.   MRN: [id number]  HPI  Pt having scratchy throat, cough, sinus pressure, sneezing, runny rose and eyes since Saturday. Has tried Allegra and Claritin with some relief. Thinks it seasonal allergies but is not sure.     Pt is also concerned about back pain, states "it feels like nerves are standing on end on his back". Going on for the past 6 to 8 months. Location below left shoulder.  Describes it as feeling raw, like sand paper or a lot of pins and needle sticking him.  At first thought it was allergies so he tried allergy medication (allerga and Claritin) as well as changing detergents but denies trying pain medications. Not sure if anything is making it worse but knows it is not getting better.   Had some life changes go on. Increased stress in life. Has been taking care of wife who was severely ill.  Denies smoking, alcohol and illicit drug use.      Review of Systems  Constitutional: Negative for chills, diaphoresis, fatigue and fever.  HENT: Positive for congestion, rhinorrhea, sinus pressure, sneezing and sore throat. Negative for ear pain and trouble swallowing.   Eyes: Positive for discharge and itching.       Watery eyes  Respiratory: Negative for cough, chest tightness, shortness of breath and wheezing.   Cardiovascular: Negative for chest pain.  Gastrointestinal: Negative.  Negative for abdominal distention, abdominal pain, constipation, diarrhea, nausea and vomiting.  Skin: Positive for rash.       Lower shoulder blade on the left side, minimally pruritic, painful at times.        Objective:   Physical Exam Constitutional:      General: He is not in acute distress.    Appearance: Normal appearance. He is not ill-appearing.  HENT:     Ears:     Comments: TMs: mild clear effusion with mild sclerotic changes. No erythema.     Mouth/Throat:     Mouth: Mucous membranes are moist.      Comments: Pharynx moderate erythema. No exudate. RST neg.  Eyes:     Conjunctiva/sclera: Conjunctivae normal.  Neck:     Comments: Neck supple with mild anterior adenopathy.  Cardiovascular:     Rate and Rhythm: Normal rate and regular rhythm.     Heart sounds: Normal heart sounds. No murmur heard.   Pulmonary:     Effort: Pulmonary effort is normal. No respiratory distress.     Breath sounds: Normal breath sounds. No wheezing.  Lymphadenopathy:     Cervical: Cervical adenopathy present.  Skin:    Comments: Very faint fine pink papular rash near the bottom of the left scapula approx 6 cm wide following a dermatonal pattern. No rash on the left. Minimally sensitive to palpation. No excoriation noted.   Neurological:     Mental Status: He is alert and oriented to person, place, and time.  Psychiatric:        Mood and Affect: Mood normal.        Behavior: Behavior normal.        Thought Content: Thought content normal.        Judgment: Judgment normal.       Today's Vitals   08/08/20 1600  BP: (!) 152/90  Pulse: 92  Temp: 98.4 F (36.9 C)  TempSrc: Temporal  SpO2: 98%   There is no height or weight on  file to calculate BMI.     Assessment & Plan:   Problem List Items Addressed This Visit      Endocrine   Type 2 diabetes mellitus without complications ([place]) - Primary   Relevant Orders   Hemoglobin A1c (Completed)     Other   Neuropathic pain    Other Visit Diagnoses    Cough       Relevant Orders   Novel Coronavirus, NAA ([facility]) (Completed)   Mid back pain on left side          Meds ordered this encounter  Medications  . gabapentin (NEURONTIN) 300 MG capsule    Sig: Take 1 capsule (300 mg total) by mouth 2 (two) times daily.    Dispense:  60 capsule    Refill:  0    Order Specific Question:   Supervising Provider    Answer:   Sallee Lange A [[id number]]  . triamcinolone (KENALOG) 0.1 %    Sig: Apply 1 application topically 2 (two) times daily. Prn rash;  use up to 2 weeks    Dispense:  30 g    Refill:  0    Order Specific Question:   Supervising Provider    Answer:   Sallee Lange A [id number]    Plan/Education  Trial of Gabapentin and Triamcinolone for rash. Call back in 3-4 weeks if no improvement, sooner if worse.   Due to A1C today.   OTC meds as directed for congestion. COVID test pending. Go to ED or urgent care over the weekend if worse.   Return in about 3 months (around 11/08/2020).

## 2020-08-09 ENCOUNTER — Encounter: Payer: Self-pay | Admitting: Nurse Practitioner

## 2020-08-09 LAB — HEMOGLOBIN A1C
Est. average glucose Bld gHb Est-mCnc: 177 mg/dL
Hgb A1c MFr Bld: 7.8 % — ABNORMAL HIGH (ref 4.8–5.6)

## 2020-08-09 LAB — SARS-COV-2, NAA 2 DAY TAT

## 2020-08-09 LAB — NOVEL CORONAVIRUS, NAA: SARS-CoV-2, NAA: NOT DETECTED

## 2020-09-07 ENCOUNTER — Other Ambulatory Visit: Payer: Self-pay | Admitting: Family Medicine

## 2020-09-18 ENCOUNTER — Other Ambulatory Visit: Payer: Self-pay

## 2020-09-18 ENCOUNTER — Ambulatory Visit
Admission: EM | Admit: 2020-09-18 | Discharge: 2020-09-18 | Disposition: A | Discharge status: Home / Self Care | Payer: BC Managed Care – PPO | Attending: Emergency Medicine | Admitting: Emergency Medicine

## 2020-09-18 DIAGNOSIS — Z1152 Encounter for screening for COVID-19: Secondary | ICD-10-CM

## 2020-09-18 NOTE — ED Triage Notes (Signed)
Pt was positive 2 weeks ago with home test , no longer has symptoms, need retest for medical appointment

## 2020-09-19 ENCOUNTER — Other Ambulatory Visit: Payer: Self-pay | Admitting: Nurse Practitioner

## 2020-09-19 LAB — NOVEL CORONAVIRUS, NAA: SARS-CoV-2, NAA: DETECTED — AB

## 2020-09-19 LAB — SARS-COV-2, NAA 2 DAY TAT

## 2020-10-02 ENCOUNTER — Other Ambulatory Visit: Payer: Self-pay | Admitting: Family Medicine

## 2020-10-13 ENCOUNTER — Other Ambulatory Visit: Payer: Self-pay | Admitting: Family Medicine

## 2020-10-14 ENCOUNTER — Encounter: Payer: Self-pay | Admitting: Family Medicine

## 2020-10-14 ENCOUNTER — Ambulatory Visit (INDEPENDENT_AMBULATORY_CARE_PROVIDER_SITE_OTHER): Payer: BC Managed Care – PPO | Admitting: Family Medicine

## 2020-10-14 ENCOUNTER — Other Ambulatory Visit: Payer: Self-pay

## 2020-10-14 VITALS — BP 126/73 | HR 87 | Temp 98.8°F | Ht 66.0 in | Wt 194.8 lb

## 2020-10-14 DIAGNOSIS — I10 Essential (primary) hypertension: Secondary | ICD-10-CM

## 2020-10-14 DIAGNOSIS — E119 Type 2 diabetes mellitus without complications: Secondary | ICD-10-CM | POA: Diagnosis not present

## 2020-10-14 DIAGNOSIS — E782 Mixed hyperlipidemia: Secondary | ICD-10-CM

## 2020-10-14 DIAGNOSIS — Z79899 Other long term (current) drug therapy: Secondary | ICD-10-CM | POA: Diagnosis not present

## 2020-10-14 MED ORDER — PANTOPRAZOLE SODIUM 40 MG PO TBEC: 40.0000 mg | DELAYED_RELEASE_TABLET | Freq: Every day | ORAL | 1 refills | Status: DC

## 2020-10-14 MED ORDER — GLIPIZIDE 5 MG PO TABS: 10.0000 mg | ORAL_TABLET | Freq: Two times a day (BID) | ORAL | 1 refills | Status: DC

## 2020-10-14 MED ORDER — LOSARTAN POTASSIUM 100 MG PO TABS: 1.0000 | ORAL_TABLET | Freq: Every day | ORAL | 1 refills | Status: DC

## 2020-10-14 MED ORDER — TAMSULOSIN HCL 0.4 MG PO CAPS: 0.4000 mg | ORAL_CAPSULE | Freq: Every day | ORAL | 1 refills | Status: DC

## 2020-10-14 MED ORDER — METFORMIN HCL 1000 MG PO TABS: 1000.0000 mg | ORAL_TABLET | Freq: Two times a day (BID) | ORAL | 1 refills | Status: DC

## 2020-10-14 MED ORDER — SIMVASTATIN 40 MG PO TABS: 40.0000 mg | ORAL_TABLET | Freq: Every day | ORAL | 1 refills | Status: DC

## 2020-10-14 NOTE — Progress Notes (Signed)
Patient ID: Tristan Mccarty, male    DOB: [birth year], 65 y.o.   MRN: [id number]   Chief Complaint  Patient presents with   Diabetes   Hypertension   Hyperlipidemia   Subjective:    HPI Pt here for 6 month follow up. Pt states sugars have been elevated but have been coming down. Checks sugars about every morning. Pt taking meds as directed. No issues.   Dm2- Compliant with medications. Checking blood glucose.   Not seeing any high or low numbers.  Denies polyuria or polydipsia.  Eye exam: up to date, no retinopathy. Foot exam: no new concerns.  HLD- doing well no new concerns.  Compliant with meds. No chest pain, palpitations, myalgias or joint pains.  HTN Pt compliant with BP meds.  No SEs Denies chest pain, sob, LE swelling, or blurry vision. Seeing elevated bp 140-150s at home.  Wife- In 1/22- left lower leg removed due to sepsis and didn't get blood flow to left lower foot.  Had bka on left.   Medical History Traven has a past medical history of Hyperlipidemia, Hypertension, Reflux, and Type 2 diabetes mellitus ([place]).   Outpatient Encounter Medications as of 10/14/2020  Medication Sig   amLODipine (NORVASC) 5 MG tablet Take 1 tablet (5 mg total) by mouth 2 (two) times daily.   aspirin 81 MG tablet Take 81 mg by mouth. Take one BID   FARXIGA 10 MG TABS tablet TAKE 1 TABLET BY MOUTH EVERY DAY   gabapentin (NEURONTIN) 300 MG capsule TAKE 1 CAPSULE BY MOUTH TWICE A DAY   glucose blood (ONETOUCH ULTRA) test strip 1 each by Other route 2 (two) times daily. USE TO TEST BLOOD SUGAR DAILY   Ibuprofen-diphenhydrAMINE Cit 200-38 MG TABS Take by mouth. Take as needed for sleep   ketoconazole (NIZORAL) 2 % cream Apply 1 application topically 2 (two) times daily.   triamcinolone (KENALOG) 0.1 % Apply 1 application topically 2 (two) times daily. Prn rash; use up to 2 weeks   [DISCONTINUED] glipiZIDE (GLUCOTROL) 5 MG tablet TAKE 2 TABLETS (10 MG TOTAL) BY MOUTH 2 (TWO) TIMES  DAILY BEFORE A MEAL.   [DISCONTINUED] losartan (COZAAR) 100 MG tablet TAKE 1 TABLET BY MOUTH EVERY DAY   [DISCONTINUED] metFORMIN (GLUCOPHAGE) 1000 MG tablet Take 1 tablet (1,000 mg total) by mouth 2 (two) times daily.   [DISCONTINUED] pantoprazole (PROTONIX) 40 MG tablet Take 1 tablet (40 mg total) by mouth daily.   [DISCONTINUED] simvastatin (ZOCOR) 40 MG tablet Take 1 tablet (40 mg total) by mouth daily.   [DISCONTINUED] tamsulosin (FLOMAX) 0.4 MG CAPS capsule TAKE 1 CAPSULE BY MOUTH EVERY DAY   glipiZIDE (GLUCOTROL) 5 MG tablet Take 2 tablets (10 mg total) by mouth 2 (two) times daily before a meal.   losartan (COZAAR) 100 MG tablet Take 1 tablet (100 mg total) by mouth daily.   metFORMIN (GLUCOPHAGE) 1000 MG tablet Take 1 tablet (1,000 mg total) by mouth 2 (two) times daily.   pantoprazole (PROTONIX) 40 MG tablet Take 1 tablet (40 mg total) by mouth daily.   simvastatin (ZOCOR) 40 MG tablet Take 1 tablet (40 mg total) by mouth daily.   tamsulosin (FLOMAX) 0.4 MG CAPS capsule Take 1 capsule (0.4 mg total) by mouth daily.   No facility-administered encounter medications on file as of 10/14/2020.     Review of Systems  Constitutional:  Negative for chills and fever.  HENT:  Negative for congestion, rhinorrhea and sore throat.   Respiratory:  Negative for cough, shortness of breath and wheezing.   Cardiovascular:  Negative for chest pain and leg swelling.  Gastrointestinal:  Negative for abdominal pain, diarrhea, nausea and vomiting.  Genitourinary:  Negative for dysuria and frequency.  Skin:  Negative for rash.  Neurological:  Negative for dizziness, weakness and headaches.    Vitals BP 126/73   Pulse 87   Temp 98.8 F (37.1 C)   Ht 5\' 6"  (1.676 m)   Wt 194 lb 12.8 oz (88.4 kg)   SpO2 97%   BMI 31.44 kg/m   Objective:   Physical Exam Vitals and nursing note reviewed.  Constitutional:      General: He is not in acute distress.    Appearance: Normal appearance. He is not  ill-appearing.  HENT:     Head: Normocephalic.     Nose: Nose normal. No congestion.     Mouth/Throat:     Mouth: Mucous membranes are moist.     Pharynx: No oropharyngeal exudate.  Eyes:     Extraocular Movements: Extraocular movements intact.     Conjunctiva/sclera: Conjunctivae normal.     Pupils: Pupils are equal, round, and reactive to light.  Cardiovascular:     Rate and Rhythm: Normal rate and regular rhythm.     Pulses: Normal pulses.     Heart sounds: Normal heart sounds. No murmur heard. Pulmonary:     Effort: Pulmonary effort is normal.     Breath sounds: Normal breath sounds. No wheezing, rhonchi or rales.  Musculoskeletal:        General: Normal range of motion.     Right lower leg: No edema.     Left lower leg: No edema.  Skin:    General: Skin is warm and dry.     Findings: No rash.  Neurological:     General: No focal deficit present.     Mental Status: He is alert and oriented to person, place, and time.     Cranial Nerves: No cranial nerve deficit.  Psychiatric:        Mood and Affect: Mood normal.        Behavior: Behavior normal.        Thought Content: Thought content normal.        Judgment: Judgment normal.     Assessment and Plan   1. Type 2 diabetes mellitus without complication, without long-term current use of insulin ([place])  2. Essential hypertension, benign  3. Mixed hyperlipidemia   Pt to get labs today,non fasting.  HR at 111 and bp elevated 150/76.  Will recheck.- on recheck improved.   Pt to cont to watch diet and dec carbs in diet.   dm2- recheck labs.  Slight elevated a1c on last visit 7.8.   Hld- stable. Cont meds.  Htn- stable. Cont meds.  Return in about 6 months (around 04/15/2021) for f/u htn, hld, dm2.  BP Readings from Last 3 Encounters:  10/14/20 126/73  09/18/20 132/85  08/08/20 (!) 152/90

## 2020-10-15 LAB — CBC WITH DIFFERENTIAL/PLATELET
Basophils Absolute: 0.1 10*3/uL (ref 0.0–0.2)
Basos: 1 %
EOS (ABSOLUTE): 0.3 10*3/uL (ref 0.0–0.4)
Eos: 4 %
Hematocrit: 42.8 % (ref 37.5–51.0)
Hemoglobin: 14.4 g/dL (ref 13.0–17.7)
Immature Grans (Abs): 0 10*3/uL (ref 0.0–0.1)
Immature Granulocytes: 0 %
Lymphocytes Absolute: 2 10*3/uL (ref 0.7–3.1)
Lymphs: 29 %
MCH: 30.4 pg (ref 26.6–33.0)
MCHC: 33.6 g/dL (ref 31.5–35.7)
MCV: 91 fL (ref 79–97)
Monocytes Absolute: 0.5 10*3/uL (ref 0.1–0.9)
Monocytes: 7 %
Neutrophils Absolute: 4.1 10*3/uL (ref 1.4–7.0)
Neutrophils: 59 %
Platelets: 185 10*3/uL (ref 150–450)
RBC: 4.73 x10E6/uL (ref 4.14–5.80)
RDW: 11.9 % (ref 11.6–15.4)
WBC: 6.9 10*3/uL (ref 3.4–10.8)

## 2020-10-15 LAB — COMPREHENSIVE METABOLIC PANEL
ALT: 15 IU/L (ref 0–44)
AST: 14 IU/L (ref 0–40)
Albumin/Globulin Ratio: 1.8 (ref 1.2–2.2)
Albumin: 4.2 g/dL (ref 3.8–4.8)
Alkaline Phosphatase: 62 IU/L (ref 44–121)
BUN/Creatinine Ratio: 11 (ref 10–24)
BUN: 9 mg/dL (ref 8–27)
Bilirubin Total: 0.4 mg/dL (ref 0.0–1.2)
CO2: 21 mmol/L (ref 20–29)
Calcium: 9 mg/dL (ref 8.6–10.2)
Chloride: 99 mmol/L (ref 96–106)
Creatinine, Ser: 0.79 mg/dL (ref 0.76–1.27)
Globulin, Total: 2.3 g/dL (ref 1.5–4.5)
Glucose: 298 mg/dL — ABNORMAL HIGH (ref 65–99)
Potassium: 4.2 mmol/L (ref 3.5–5.2)
Sodium: 139 mmol/L (ref 134–144)
Total Protein: 6.5 g/dL (ref 6.0–8.5)
eGFR: 100 mL/min/{1.73_m2} (ref >59)

## 2020-10-15 LAB — MICROALBUMIN / CREATININE URINE RATIO
Creatinine, Urine: 50.6 mg/dL
Microalb/Creat Ratio: <6 mg/g creat (ref 0–29)
Microalbumin, Urine: <3 ug/mL

## 2020-10-15 LAB — LIPID PANEL
Chol/HDL Ratio: 3.3 ratio (ref 0.0–5.0)
Cholesterol, Total: 143 mg/dL (ref 100–199)
HDL: 44 mg/dL (ref >39)
LDL Chol Calc (NIH): 68 mg/dL (ref 0–99)
Triglycerides: 183 mg/dL — ABNORMAL HIGH (ref 0–149)
VLDL Cholesterol Cal: 31 mg/dL (ref 5–40)

## 2020-10-15 LAB — HEMOGLOBIN A1C
Est. average glucose Bld gHb Est-mCnc: 194 mg/dL
Hgb A1c MFr Bld: 8.4 % — ABNORMAL HIGH (ref 4.8–5.6)

## 2020-11-03 ENCOUNTER — Other Ambulatory Visit: Payer: Self-pay | Admitting: Family Medicine

## 2020-11-03 DIAGNOSIS — I10 Essential (primary) hypertension: Secondary | ICD-10-CM

## 2020-11-08 ENCOUNTER — Other Ambulatory Visit: Payer: Self-pay | Admitting: Family Medicine

## 2020-11-08 DIAGNOSIS — I10 Essential (primary) hypertension: Secondary | ICD-10-CM

## 2020-11-26 ENCOUNTER — Other Ambulatory Visit: Payer: Self-pay | Admitting: Family Medicine

## 2020-11-26 DIAGNOSIS — E119 Type 2 diabetes mellitus without complications: Secondary | ICD-10-CM

## 2020-12-16 ENCOUNTER — Other Ambulatory Visit: Payer: Self-pay | Admitting: Family Medicine

## 2020-12-16 ENCOUNTER — Other Ambulatory Visit: Payer: Self-pay | Admitting: Nurse Practitioner

## 2020-12-16 DIAGNOSIS — I10 Essential (primary) hypertension: Secondary | ICD-10-CM

## 2020-12-16 DIAGNOSIS — E119 Type 2 diabetes mellitus without complications: Secondary | ICD-10-CM

## 2020-12-16 DIAGNOSIS — Z79899 Other long term (current) drug therapy: Secondary | ICD-10-CM

## 2020-12-17 NOTE — Telephone Encounter (Signed)
Unable to reach patient; labs mailed to patient

## 2020-12-17 NOTE — Telephone Encounter (Signed)
pls make sure pt knows to get labs prior to appt. Thx. Dr. Lovena Le

## 2021-01-09 DIAGNOSIS — E119 Type 2 diabetes mellitus without complications: Secondary | ICD-10-CM | POA: Diagnosis not present

## 2021-01-09 DIAGNOSIS — Z79899 Other long term (current) drug therapy: Secondary | ICD-10-CM | POA: Diagnosis not present

## 2021-01-09 DIAGNOSIS — I10 Essential (primary) hypertension: Secondary | ICD-10-CM | POA: Diagnosis not present

## 2021-01-10 LAB — COMPREHENSIVE METABOLIC PANEL
ALT: 17 IU/L (ref 0–44)
AST: 15 IU/L (ref 0–40)
Albumin/Globulin Ratio: 2.1 (ref 1.2–2.2)
Albumin: 4.9 g/dL — ABNORMAL HIGH (ref 3.8–4.8)
Alkaline Phosphatase: 58 IU/L (ref 44–121)
BUN/Creatinine Ratio: 19 (ref 10–24)
BUN: 16 mg/dL (ref 8–27)
Bilirubin Total: 0.3 mg/dL (ref 0.0–1.2)
CO2: 25 mmol/L (ref 20–29)
Calcium: 9.5 mg/dL (ref 8.6–10.2)
Chloride: 102 mmol/L (ref 96–106)
Creatinine, Ser: 0.83 mg/dL (ref 0.76–1.27)
Globulin, Total: 2.3 g/dL (ref 1.5–4.5)
Glucose: 141 mg/dL — ABNORMAL HIGH (ref 65–99)
Potassium: 5.1 mmol/L (ref 3.5–5.2)
Sodium: 139 mmol/L (ref 134–144)
Total Protein: 7.2 g/dL (ref 6.0–8.5)
eGFR: 98 mL/min/{1.73_m2} (ref >59)

## 2021-01-10 LAB — HEMOGLOBIN A1C
Est. average glucose Bld gHb Est-mCnc: 171 mg/dL
Hgb A1c MFr Bld: 7.6 % — ABNORMAL HIGH (ref 4.8–5.6)

## 2021-01-14 ENCOUNTER — Ambulatory Visit (INDEPENDENT_AMBULATORY_CARE_PROVIDER_SITE_OTHER): Payer: BC Managed Care – PPO | Admitting: Family Medicine

## 2021-01-14 ENCOUNTER — Other Ambulatory Visit: Payer: Self-pay

## 2021-01-14 ENCOUNTER — Encounter: Payer: Self-pay | Admitting: Family Medicine

## 2021-01-14 VITALS — BP 138/72 | HR 69 | Ht 66.0 in | Wt 188.6 lb

## 2021-01-14 DIAGNOSIS — E119 Type 2 diabetes mellitus without complications: Secondary | ICD-10-CM | POA: Diagnosis not present

## 2021-01-14 DIAGNOSIS — Z1211 Encounter for screening for malignant neoplasm of colon: Secondary | ICD-10-CM

## 2021-01-14 DIAGNOSIS — R059 Cough, unspecified: Secondary | ICD-10-CM

## 2021-01-14 DIAGNOSIS — E782 Mixed hyperlipidemia: Secondary | ICD-10-CM

## 2021-01-14 DIAGNOSIS — I10 Essential (primary) hypertension: Secondary | ICD-10-CM | POA: Diagnosis not present

## 2021-01-14 DIAGNOSIS — M792 Neuralgia and neuritis, unspecified: Secondary | ICD-10-CM | POA: Diagnosis not present

## 2021-01-14 MED ORDER — PANTOPRAZOLE SODIUM 40 MG PO TBEC: 40.0000 mg | DELAYED_RELEASE_TABLET | Freq: Every day | ORAL | 1 refills | Status: DC

## 2021-01-14 MED ORDER — SIMVASTATIN 40 MG PO TABS: 40.0000 mg | ORAL_TABLET | Freq: Every day | ORAL | 1 refills | Status: DC

## 2021-01-14 MED ORDER — LOSARTAN POTASSIUM 100 MG PO TABS: 100.0000 mg | ORAL_TABLET | Freq: Every day | ORAL | 1 refills | Status: DC

## 2021-01-14 MED ORDER — ONETOUCH ULTRA VI STRP: ORAL_STRIP | 1 refills | Status: DC

## 2021-01-14 MED ORDER — GABAPENTIN 300 MG PO CAPS: 300.0000 mg | ORAL_CAPSULE | Freq: Three times a day (TID) | ORAL | 2 refills | Status: DC

## 2021-01-14 MED ORDER — TAMSULOSIN HCL 0.4 MG PO CAPS: 0.4000 mg | ORAL_CAPSULE | Freq: Every day | ORAL | 1 refills | Status: DC

## 2021-01-14 MED ORDER — METFORMIN HCL 1000 MG PO TABS
1000.0000 mg | ORAL_TABLET | Freq: Two times a day (BID) | ORAL | 1 refills | Status: DC
Start: 2021-01-14 — End: 2021-05-20

## 2021-01-14 MED ORDER — DAPAGLIFLOZIN PROPANEDIOL 10 MG PO TABS: 10.0000 mg | ORAL_TABLET | Freq: Every day | ORAL | 1 refills | Status: DC

## 2021-01-14 MED ORDER — GLIPIZIDE 5 MG PO TABS: 10.0000 mg | ORAL_TABLET | Freq: Two times a day (BID) | ORAL | 1 refills | Status: DC

## 2021-01-14 MED ORDER — AMLODIPINE BESYLATE 5 MG PO TABS: 5.0000 mg | ORAL_TABLET | Freq: Two times a day (BID) | ORAL | 1 refills | Status: DC

## 2021-01-14 NOTE — Progress Notes (Signed)
Patient ID: Tristan Mccarty, male    DOB: 24-Nov-1955, 65 y.o.   MRN: [id number]   Chief Complaint  Patient presents with   Diabetes   Subjective:     HPI Discuss recent labs- patient has a dry nagging cough -worse at night -feeling coughng when laying down.  Lasting 30sec- 1 min per episode.  No fever, runny nose, sore throat.  Intermittent ear pain on rt.  Not sick with uri recently.  No meds for this.  Pt doing well.  Changed some habits.  Stopped drinking coffee with creamer. A1c 8.4 to 7.6.  Gerd- stable. Not taking protonix daily. Occ bread or chips bother stomach in afternoon. Red sauce doesn't hurt.  Pt having some increase in back pain.  Still has neuropathy in the feet.  Wondering if he can increase dose of gabapentin.  Medical History Ritesh has a past medical history of Hyperlipidemia, Hypertension, Reflux, and Type 2 diabetes mellitus ([place]).   Outpatient Encounter Medications as of 01/14/2021  Medication Sig   amLODipine (NORVASC) 5 MG tablet Take 1 tablet (5 mg total) by mouth 2 (two) times daily.   aspirin 81 MG tablet Take 81 mg by mouth. Take one BID   dapagliflozin propanediol (FARXIGA) 10 MG TABS tablet Take 1 tablet (10 mg total) by mouth daily.   gabapentin (NEURONTIN) 300 MG capsule Take 1 capsule (300 mg total) by mouth 3 (three) times daily.   glipiZIDE (GLUCOTROL) 5 MG tablet Take 2 tablets (10 mg total) by mouth 2 (two) times daily before a meal.   glucose blood (ONETOUCH ULTRA) test strip USE 1 STRIP TWICE DAILY AS DIRECTED   Ibuprofen-diphenhydrAMINE Cit 200-38 MG TABS Take by mouth. Take as needed for sleep   ketoconazole (NIZORAL) 2 % cream Apply 1 application topically 2 (two) times daily.   losartan (COZAAR) 100 MG tablet Take 1 tablet (100 mg total) by mouth daily.   metFORMIN (GLUCOPHAGE) 1000 MG tablet Take 1 tablet (1,000 mg total) by mouth 2 (two) times daily.   pantoprazole (PROTONIX) 40 MG tablet Take 1 tablet (40 mg total) by mouth  daily.   simvastatin (ZOCOR) 40 MG tablet Take 1 tablet (40 mg total) by mouth daily.   tamsulosin (FLOMAX) 0.4 MG CAPS capsule Take 1 capsule (0.4 mg total) by mouth daily.   triamcinolone (KENALOG) 0.1 % Apply 1 application topically 2 (two) times daily. Prn rash; use up to 2 weeks   [DISCONTINUED] amLODipine (NORVASC) 5 MG tablet TAKE 1 TABLET BY MOUTH TWICE A DAY   [DISCONTINUED] FARXIGA 10 MG TABS tablet TAKE 1 TABLET BY MOUTH EVERY DAY   [DISCONTINUED] gabapentin (NEURONTIN) 300 MG capsule TAKE 1 CAPSULE BY MOUTH TWICE A DAY   [DISCONTINUED] glipiZIDE (GLUCOTROL) 5 MG tablet Take 2 tablets (10 mg total) by mouth 2 (two) times daily before a meal.   [DISCONTINUED] losartan (COZAAR) 100 MG tablet Take 1 tablet (100 mg total) by mouth daily.   [DISCONTINUED] metFORMIN (GLUCOPHAGE) 1000 MG tablet Take 1 tablet (1,000 mg total) by mouth 2 (two) times daily.   [DISCONTINUED] ONETOUCH ULTRA test strip USE 1 STRIP TWICE DAILY AS DIRECTED   [DISCONTINUED] pantoprazole (PROTONIX) 40 MG tablet Take 1 tablet (40 mg total) by mouth daily.   [DISCONTINUED] simvastatin (ZOCOR) 40 MG tablet Take 1 tablet (40 mg total) by mouth daily.   [DISCONTINUED] tamsulosin (FLOMAX) 0.4 MG CAPS capsule Take 1 capsule (0.4 mg total) by mouth daily.   No facility-administered encounter medications on file  as of 01/14/2021.     Review of Systems  Constitutional:  Negative for chills and fever.  HENT:  Negative for congestion, rhinorrhea and sore throat.   Respiratory:  Negative for cough, shortness of breath and wheezing.   Cardiovascular:  Negative for chest pain and leg swelling.  Gastrointestinal:  Negative for abdominal pain, diarrhea, nausea and vomiting.  Genitourinary:  Negative for dysuria and frequency.  Skin:  Negative for rash.  Neurological:  Negative for dizziness, weakness and headaches.    Vitals BP 138/72   Pulse 69   Ht '5\' 6"'$  (1.676 m)   Wt 188 lb 9.6 oz (85.5 kg)   BMI 30.44 kg/m    Objective:   Physical Exam Vitals and nursing note reviewed.  Constitutional:      General: He is not in acute distress.    Appearance: Normal appearance. He is not ill-appearing.  HENT:     Head: Normocephalic.     Right Ear: Tympanic membrane normal.     Left Ear: Tympanic membrane normal.     Nose: Nose normal. No congestion.     Mouth/Throat:     Mouth: Mucous membranes are moist.     Pharynx: No oropharyngeal exudate.  Eyes:     Extraocular Movements: Extraocular movements intact.     Conjunctiva/sclera: Conjunctivae normal.     Pupils: Pupils are equal, round, and reactive to light.  Cardiovascular:     Rate and Rhythm: Normal rate and regular rhythm.     Pulses: Normal pulses.     Heart sounds: Normal heart sounds. No murmur heard. Pulmonary:     Effort: Pulmonary effort is normal. No respiratory distress.     Breath sounds: Normal breath sounds. No wheezing, rhonchi or rales.  Musculoskeletal:        General: Normal range of motion.     Cervical back: Normal range of motion.     Right lower leg: No edema.     Left lower leg: No edema.  Skin:    General: Skin is warm and dry.     Findings: No rash.  Neurological:     General: No focal deficit present.     Mental Status: He is alert and oriented to person, place, and time.     Cranial Nerves: No cranial nerve deficit.  Psychiatric:        Mood and Affect: Mood normal.        Behavior: Behavior normal.        Thought Content: Thought content normal.        Judgment: Judgment normal.     Assessment and Plan   1. Type 2 diabetes mellitus without complication, without long-term current use of insulin ([facility]) - glucose blood (ONETOUCH ULTRA) test strip; USE 1 STRIP TWICE DAILY AS DIRECTED  Dispense: 100 strip; Refill: 1 - dapagliflozin propanediol (FARXIGA) 10 MG TABS tablet; Take 1 tablet (10 mg total) by mouth daily.  Dispense: 90 tablet; Refill: 1 - glipiZIDE (GLUCOTROL) 5 MG tablet; Take 2 tablets (10 mg  total) by mouth 2 (two) times daily before a meal.  Dispense: 360 tablet; Refill: 1 - metFORMIN (GLUCOPHAGE) 1000 MG tablet; Take 1 tablet (1,000 mg total) by mouth 2 (two) times daily.  Dispense: 180 tablet; Refill: 1  2. Essential hypertension, benign - amLODipine (NORVASC) 5 MG tablet; Take 1 tablet (5 mg total) by mouth 2 (two) times daily.  Dispense: 180 tablet; Refill: 1 - losartan (COZAAR) 100 MG tablet; Take 1 tablet (  100 mg total) by mouth daily.  Dispense: 90 tablet; Refill: 1  3. Neuropathic pain - gabapentin (NEURONTIN) 300 MG capsule; Take 1 capsule (300 mg total) by mouth 3 (three) times daily.  Dispense: 90 capsule; Refill: 2  4. Mixed hyperlipidemia - simvastatin (ZOCOR) 40 MG tablet; Take 1 tablet (40 mg total) by mouth daily.  Dispense: 90 tablet; Refill: 1  5. Colon cancer screening - Ambulatory referral to Gastroenterology   Dm2- improving. Due for eye exam.  Pt to get this soon.  Pt declining referral. Has neuropathy and taking '300mg'$  gabapentin, will increase to '300mg'$  tid prn.  HM-  Pt requesting referral for colon cancer screening.  Dm2- improving. Cont to watch diet and low carb diet and inc in exercising.  Hld- stable. Cont meds.  Htn- suboptimal.  Cont to dec salt in diet.  Cont meds  Return in about 6 months (around 07/14/2021) for f/u dm2, htn, gerd, bph.

## 2021-01-28 ENCOUNTER — Other Ambulatory Visit: Payer: Self-pay

## 2021-01-28 ENCOUNTER — Ambulatory Visit (INDEPENDENT_AMBULATORY_CARE_PROVIDER_SITE_OTHER): Payer: BC Managed Care – PPO | Admitting: Family Medicine

## 2021-01-28 VITALS — BP 125/76 | HR 78 | Ht 66.0 in | Wt 188.0 lb

## 2021-01-28 DIAGNOSIS — E782 Mixed hyperlipidemia: Secondary | ICD-10-CM

## 2021-01-28 DIAGNOSIS — Z0000 Encounter for general adult medical examination without abnormal findings: Secondary | ICD-10-CM

## 2021-01-28 DIAGNOSIS — I10 Essential (primary) hypertension: Secondary | ICD-10-CM | POA: Diagnosis not present

## 2021-01-28 DIAGNOSIS — B356 Tinea cruris: Secondary | ICD-10-CM

## 2021-01-28 DIAGNOSIS — E119 Type 2 diabetes mellitus without complications: Secondary | ICD-10-CM | POA: Diagnosis not present

## 2021-01-28 DIAGNOSIS — Z1211 Encounter for screening for malignant neoplasm of colon: Secondary | ICD-10-CM

## 2021-01-28 MED ORDER — FLUCONAZOLE 150 MG PO TABS: ORAL_TABLET | ORAL | 0 refills | Status: DC

## 2021-01-28 MED ORDER — KETOCONAZOLE 2 % EX CREA: 1.0000 "application " | TOPICAL_CREAM | Freq: Two times a day (BID) | CUTANEOUS | 2 refills | Status: DC

## 2021-01-28 NOTE — Progress Notes (Signed)
Patient ID: Tristan Mccarty, male    DOB: 1956/01/06, 65 y.o.   MRN: [id number]   Chief Complaint  Patient presents with   Annual Exam   Subjective:    HPI The patient comes in today for a wellness visit.  A review of their health history was completed.  A review of medications was also completed.  Any needed refills; no  Eating habits: trying to eat healthy  Falls/  MVA accidents in past few months: no  Regular exercise: walks a lot  Specialist pt sees on regular basis: eye doctor  Preventative health issues were discussed.   Additional concerns: concerned about possible side effects from War- does not want insulin  Wondering if had yeast infections and wondering if farxiga is causing yeast infections. Pt stating has phobia about needles.  Has it every 2-3 months.  Using lotrimin cream when flares up. Has job in hot environment and sweating.   Needing another tetanus vaccine. Last one 2012.  Medical History Tristan Mccarty has a past medical history of Hyperlipidemia, Hypertension, Reflux, and Type 2 diabetes mellitus ([place] Mccarty).   Outpatient Encounter Medications as of 01/28/2021  Medication Sig   fluconazole (DIFLUCAN) 150 MG tablet Take 1 tab p.o. at onset of groin rash. May repeat in 1 wk.   amLODipine (NORVASC) 5 MG tablet Take 1 tablet (5 mg total) by mouth 2 (two) times daily.   aspirin 81 MG tablet Take 81 mg by mouth. Take one BID   dapagliflozin propanediol (FARXIGA) 10 MG TABS tablet Take 1 tablet (10 mg total) by mouth daily.   gabapentin (NEURONTIN) 300 MG capsule Take 1 capsule (300 mg total) by mouth 3 (three) times daily.   glipiZIDE (GLUCOTROL) 5 MG tablet Take 2 tablets (10 mg total) by mouth 2 (two) times daily before a meal.   glucose blood (ONETOUCH ULTRA) test strip USE 1 STRIP TWICE DAILY AS DIRECTED   Ibuprofen-diphenhydrAMINE Cit 200-38 MG TABS Take by mouth. Take as needed for sleep   ketoconazole (NIZORAL) 2 % cream Apply 1 application topically 2  (two) times daily.   losartan (COZAAR) 100 MG tablet Take 1 tablet (100 mg total) by mouth daily.   metFORMIN (GLUCOPHAGE) 1000 MG tablet Take 1 tablet (1,000 mg total) by mouth 2 (two) times daily.   pantoprazole (PROTONIX) 40 MG tablet Take 1 tablet (40 mg total) by mouth daily.   simvastatin (ZOCOR) 40 MG tablet Take 1 tablet (40 mg total) by mouth daily.   tamsulosin (FLOMAX) 0.4 MG CAPS capsule Take 1 capsule (0.4 mg total) by mouth daily.   triamcinolone (KENALOG) 0.1 % Apply 1 application topically 2 (two) times daily. Prn rash; use up to 2 weeks   [DISCONTINUED] ketoconazole (NIZORAL) 2 % cream Apply 1 application topically 2 (two) times daily.   No facility-administered encounter medications on file as of 01/28/2021.     Review of Systems  Constitutional:  Negative for chills and fever.  HENT:  Negative for congestion, rhinorrhea and sore throat.   Respiratory:  Negative for cough, shortness of breath and wheezing.   Cardiovascular:  Negative for chest pain and leg swelling.  Gastrointestinal:  Negative for abdominal pain, diarrhea, nausea and vomiting.  Genitourinary:  Negative for dysuria and frequency.  Skin:  Positive for rash (groin intermittent).  Neurological:  Negative for dizziness, weakness and headaches.    Vitals BP 125/76   Pulse 78   Ht '5\' 6"'$  (1.676 m)   Wt 188 lb (85.3 kg)  BMI 30.34 kg/m   Objective:   Physical Exam Vitals and nursing note reviewed.  Constitutional:      General: He is not in acute distress.    Appearance: Normal appearance. He is not ill-appearing.  HENT:     Head: Normocephalic.     Right Ear: Tympanic membrane normal.     Left Ear: Tympanic membrane normal.     Nose: Nose normal. No congestion.     Mouth/Throat:     Mouth: Mucous membranes are moist.     Pharynx: No oropharyngeal exudate.  Eyes:     Extraocular Movements: Extraocular movements intact.     Conjunctiva/sclera: Conjunctivae normal.     Pupils: Pupils are  equal, round, and reactive to light.  Cardiovascular:     Rate and Rhythm: Normal rate and regular rhythm.     Pulses: Normal pulses.     Heart sounds: Normal heart sounds. No murmur heard. Pulmonary:     Effort: Pulmonary effort is normal.     Breath sounds: Normal breath sounds. No wheezing, rhonchi or rales.  Musculoskeletal:        General: Normal range of motion.     Right lower leg: No edema.     Left lower leg: No edema.  Skin:    General: Skin is warm and dry.     Findings: No rash.  Neurological:     General: No focal deficit present.     Mental Status: He is alert and oriented to person, place, and time.     Cranial Nerves: No cranial nerve deficit.  Psychiatric:        Mood and Affect: Mood normal.        Behavior: Behavior normal.        Thought Content: Thought content normal.        Judgment: Judgment normal.     Assessment and Plan   1. Well adult exam  2. Colon cancer screening - Ambulatory referral to Gastroenterology  3. Tinea cruris - ketoconazole (NIZORAL) 2 % cream; Apply 1 application topically 2 (two) times daily.  Dispense: 30 g; Refill: 2 - fluconazole (DIFLUCAN) 150 MG tablet; Take 1 tab p.o. at onset of groin rash. May repeat in 1 wk.  Dispense: 4 tablet; Refill: 0  4. Type 2 diabetes mellitus without complication, without long-term current use of insulin ([place])  5. Mixed hyperlipidemia  6. Essential hypertension, benign   HM- ordered colonoscopy Reviewed vaccines.  Pt to get them at the pharmacy.  Pt to cont to control glucose, and may need to change the fargixa if cont to have groin yeast infections.  Gave diflucan prn.  Dm2- stable.  Cont to monitor. Cont dec carbs in diet.  Hld- cont meds. Stable.   Htn- stable, cont meds. F/u 89moto recheck diabetes.  Neuropathy- stable. Cont meds -gabapetin  Return in about 4 months (around 05/30/2021) for recheck dm2..Marland Kitchen

## 2021-02-03 ENCOUNTER — Encounter: Payer: Self-pay | Admitting: Internal Medicine

## 2021-03-11 ENCOUNTER — Other Ambulatory Visit: Payer: Self-pay

## 2021-03-11 ENCOUNTER — Ambulatory Visit (INDEPENDENT_AMBULATORY_CARE_PROVIDER_SITE_OTHER): Payer: BC Managed Care – PPO | Admitting: *Deleted

## 2021-03-11 ENCOUNTER — Encounter: Payer: Self-pay | Admitting: *Deleted

## 2021-03-11 VITALS — Ht 68.0 in | Wt 186.6 lb

## 2021-03-11 DIAGNOSIS — Z1211 Encounter for screening for malignant neoplasm of colon: Secondary | ICD-10-CM

## 2021-03-11 MED ORDER — NA SULFATE-K SULFATE-MG SULF 17.5-3.13-1.6 GM/177ML PO SOLN: 1.0000 | Freq: Once | ORAL | 0 refills | Status: AC

## 2021-03-11 NOTE — Progress Notes (Addendum)
Gastroenterology Pre-Procedure Review  Request Date: 03/11/2021 Requesting Physician: Dr. Elvia Collum, Last TCS done 06/30/2009 by Dr. Oneida Alar, internal hemorrhoids  PATIENT REVIEW QUESTIONS: The patient responded to the following health history questions as indicated:    1. Diabetes Melitis: yes, type II  2. Joint replacements in the past 12 months: no 3. Major health problems in the past 3 months: no 4. Has an artificial valve or MVP: no 5. Has a defibrillator: no 6. Has been advised in past to take antibiotics in advance of a procedure like teeth cleaning: no 7. Family history of colon cancer: no  8. Alcohol Use: no 9. Illicit drug Use: no 10. History of sleep apnea: no  11. History of coronary artery or other vascular stents placed within the last 12 months: no 12. History of any prior anesthesia complications: no 13. Body mass index is 28.37 kg/m.    MEDICATIONS & ALLERGIES:    Patient reports the following regarding taking any blood thinners:   Plavix? no Aspirin? Yes, 81 mg Coumadin? no Brilinta? no Xarelto? no Eliquis? no Pradaxa? no Savaysa? no Effient? no  Patient confirms/reports the following medications:  Current Outpatient Medications  Medication Sig Dispense Refill   amLODipine (NORVASC) 5 MG tablet Take 1 tablet (5 mg total) by mouth 2 (two) times daily. 180 tablet 1   aspirin 81 MG tablet Take 81 mg by mouth. Take one BID     dapagliflozin propanediol (FARXIGA) 10 MG TABS tablet Take 1 tablet (10 mg total) by mouth daily. 90 tablet 1   gabapentin (NEURONTIN) 300 MG capsule Take 1 capsule (300 mg total) by mouth 3 (three) times daily. (Patient taking differently: Take 300 mg by mouth 3 (three) times daily. Takes 1 tablet in the morning and 2 tablets at night.) 90 capsule 2   glipiZIDE (GLUCOTROL) 5 MG tablet Take 2 tablets (10 mg total) by mouth 2 (two) times daily before a meal. 360 tablet 1   Ibuprofen-diphenhydrAMINE Cit 200-38 MG TABS Take by mouth. Take  as needed for sleep     losartan (COZAAR) 100 MG tablet Take 1 tablet (100 mg total) by mouth daily. 90 tablet 1   metFORMIN (GLUCOPHAGE) 1000 MG tablet Take 1 tablet (1,000 mg total) by mouth 2 (two) times daily. 180 tablet 1   pantoprazole (PROTONIX) 40 MG tablet Take 1 tablet (40 mg total) by mouth daily. (Patient taking differently: Take 40 mg by mouth as needed.) 90 tablet 1   simvastatin (ZOCOR) 40 MG tablet Take 1 tablet (40 mg total) by mouth daily. 90 tablet 1   tamsulosin (FLOMAX) 0.4 MG CAPS capsule Take 1 capsule (0.4 mg total) by mouth daily. 90 capsule 1   No current facility-administered medications for this visit.    Patient confirms/reports the following allergies:  Allergies  Allergen Reactions   Adipex-P [Phentermine Hcl]     Side effects   Vasotec [Enalapril]     Abdominal pain    No orders of the defined types were placed in this encounter.   AUTHORIZATION INFORMATION Primary Insurance: [place],  [place] #: [id number],  Group #: [id number] Pre-Cert / Josem Kaufmann required: No, not required per Avis Epley / Auth #: [id number]  SCHEDULE INFORMATION: Procedure has been scheduled as follows:  Date: 04/03/2021, Time: 2:00  Location: [facility] with Dr. Abbey Chatters  This Gastroenterology Pre-Precedure Review Form is being routed to the following provider(s): Roseanne Kaufman, NP

## 2021-03-12 ENCOUNTER — Encounter: Payer: Self-pay | Admitting: *Deleted

## 2021-03-12 NOTE — Progress Notes (Signed)
No glipizide morning of procedure. No metformin morning of procedure.   Appropriate. ASA 2.

## 2021-03-12 NOTE — Progress Notes (Signed)
Mailed letter to pt with diabetes medication adjustments.   

## 2021-03-16 ENCOUNTER — Other Ambulatory Visit: Payer: Self-pay | Admitting: *Deleted

## 2021-04-03 ENCOUNTER — Other Ambulatory Visit: Payer: Self-pay

## 2021-04-03 ENCOUNTER — Encounter (HOSPITAL_COMMUNITY): Payer: Self-pay

## 2021-04-03 ENCOUNTER — Ambulatory Visit (HOSPITAL_COMMUNITY): Payer: BC Managed Care – PPO | Admitting: Anesthesiology

## 2021-04-03 ENCOUNTER — Telehealth: Payer: Self-pay | Admitting: Internal Medicine

## 2021-04-03 ENCOUNTER — Encounter (HOSPITAL_COMMUNITY)
Admission: RE | Disposition: A | Discharge status: Home / Self Care | Payer: Self-pay | Source: Home / Self Care | Attending: Internal Medicine

## 2021-04-03 ENCOUNTER — Ambulatory Visit (HOSPITAL_COMMUNITY)
Admission: RE | Admit: 2021-04-03 | Discharge: 2021-04-03 | Disposition: A | Discharge status: Home / Self Care | Payer: BC Managed Care – PPO | Attending: Internal Medicine | Admitting: Internal Medicine

## 2021-04-03 DIAGNOSIS — K635 Polyp of colon: Secondary | ICD-10-CM | POA: Diagnosis not present

## 2021-04-03 DIAGNOSIS — D122 Benign neoplasm of ascending colon: Secondary | ICD-10-CM | POA: Diagnosis not present

## 2021-04-03 DIAGNOSIS — K648 Other hemorrhoids: Secondary | ICD-10-CM | POA: Insufficient documentation

## 2021-04-03 DIAGNOSIS — I10 Essential (primary) hypertension: Secondary | ICD-10-CM | POA: Insufficient documentation

## 2021-04-03 DIAGNOSIS — E119 Type 2 diabetes mellitus without complications: Secondary | ICD-10-CM | POA: Diagnosis not present

## 2021-04-03 DIAGNOSIS — Z1211 Encounter for screening for malignant neoplasm of colon: Secondary | ICD-10-CM | POA: Diagnosis not present

## 2021-04-03 HISTORY — PX: POLYPECTOMY: SHX5525

## 2021-04-03 HISTORY — PX: COLONOSCOPY WITH PROPOFOL: SHX5780

## 2021-04-03 LAB — GLUCOSE, CAPILLARY: Glucose-Capillary: 134 mg/dL — ABNORMAL HIGH (ref 70–99)

## 2021-04-03 SURGERY — COLONOSCOPY WITH PROPOFOL
Anesthesia: General

## 2021-04-03 MED ORDER — PROPOFOL 10 MG/ML IV BOLUS
INTRAVENOUS | Status: DC | PRN
  Administered 2021-04-03: 50 mg via INTRAVENOUS
  Administered 2021-04-03: 100 mg via INTRAVENOUS

## 2021-04-03 MED ORDER — LIDOCAINE HCL 1 % IJ SOLN
INTRAMUSCULAR | Status: DC | PRN
  Administered 2021-04-03: 50 mg via INTRADERMAL

## 2021-04-03 MED ORDER — PROPOFOL 500 MG/50ML IV EMUL
INTRAVENOUS | Status: DC | PRN
  Administered 2021-04-03: 100 ug/kg/min via INTRAVENOUS

## 2021-04-03 MED ORDER — LIDOCAINE HCL (PF) 2 % IJ SOLN
INTRAMUSCULAR | Status: AC
  Filled 2021-04-03: qty 5

## 2021-04-03 MED ORDER — PROPOFOL 10 MG/ML IV BOLUS
INTRAVENOUS | Status: AC
  Filled 2021-04-03: qty 40

## 2021-04-03 MED ORDER — LACTATED RINGERS IV SOLN
INTRAVENOUS | Status: DC
  Administered 2021-04-03: 1000 mL via INTRAVENOUS

## 2021-04-03 NOTE — Anesthesia Postprocedure Evaluation (Signed)
Anesthesia Post Note  Patient: Tristan Mccarty  Procedure(s) Performed: COLONOSCOPY WITH PROPOFOL POLYPECTOMY  Patient location during evaluation: Phase II Anesthesia Type: General Level of consciousness: awake Pain management: pain level controlled Vital Signs Assessment: post-procedure vital signs reviewed and stable Respiratory status: spontaneous breathing and respiratory function stable Cardiovascular status: blood pressure returned to baseline and stable Postop Assessment: no headache and no apparent nausea or vomiting Anesthetic complications: no Comments: Late entry   No notable events documented.   Last Vitals:  Vitals:   04/03/21 1327 04/03/21 1435  BP: (!) 162/81 119/65  Pulse: 71 73  Resp: 14 16  Temp: 36.8 C 36.7 C  SpO2: 96% 97%    Last Pain:  Vitals:   04/03/21 1435  TempSrc: Oral  PainSc: 0-No pain                 Louann Sjogren

## 2021-04-03 NOTE — H&P (Signed)
Primary Care Physician:  Erven Colla, DO Primary Gastroenterologist:  Dr. Abbey Chatters  Pre-Procedure History & Physical: HPI:  Tristan Mccarty is a 65 y.o. male is here for a colonoscopy for colon cancer screening purposes.  Patient denies any family history of colorectal cancer.  No melena or hematochezia.  No abdominal pain or unintentional weight loss.  No change in bowel habits.  Overall feels well from a GI standpoint.  Past Medical History:  Diagnosis Date   Hyperlipidemia    Hypertension    Reflux    Type 2 diabetes mellitus ([place])     Past Surgical History:  Procedure Laterality Date   COLONOSCOPY     EYE SURGERY      Prior to Admission medications   Medication Sig Start Date End Date Taking? Authorizing Provider  amLODipine (NORVASC) 5 MG tablet Take 1 tablet (5 mg total) by mouth 2 (two) times daily. 01/14/21  Yes Lovena Le, Malena M, DO  aspirin 81 MG tablet Take 81 mg by mouth at bedtime.   Yes [provider]  dapagliflozin propanediol (FARXIGA) 10 MG TABS tablet Take 1 tablet (10 mg total) by mouth daily. 01/14/21  Yes Lovena Le, Malena M, DO  gabapentin (NEURONTIN) 300 MG capsule Take 1 capsule (300 mg total) by mouth 3 (three) times daily. Patient taking differently: Take 300-600 mg by mouth See admin instructions. Takes 1 tablet in the morning and 2 tablets at night. 01/14/21  Yes Lovena Le, Malena M, DO  glipiZIDE (GLUCOTROL) 5 MG tablet Take 2 tablets (10 mg total) by mouth 2 (two) times daily before a meal. 01/14/21  Yes Lovena Le, Malena M, DO  Ibuprofen-diphenhydrAMINE HCl (ADVIL PM) 200-25 MG CAPS Take 2 tablets by mouth at bedtime.   Yes [provider]  losartan (COZAAR) 100 MG tablet Take 1 tablet (100 mg total) by mouth daily. 01/14/21  Yes Lovena Le, Malena M, DO  metFORMIN (GLUCOPHAGE) 1000 MG tablet Take 1 tablet (1,000 mg total) by mouth 2 (two) times daily. 01/14/21  Yes Lovena Le, Malena M, DO  pantoprazole (PROTONIX) 40 MG tablet Take 1 tablet (40 mg total) by  mouth daily. Patient taking differently: Take 40 mg by mouth daily as needed (acid reflux). 01/14/21  Yes Lovena Le, Malena M, DO  simvastatin (ZOCOR) 40 MG tablet Take 1 tablet (40 mg total) by mouth daily. 01/14/21  Yes Lovena Le, Malena M, DO  tamsulosin (FLOMAX) 0.4 MG CAPS capsule Take 1 capsule (0.4 mg total) by mouth daily. Patient taking differently: Take 0.4 mg by mouth every evening. 01/14/21  Yes Lovena Le, Malena M, DO    Allergies as of 03/12/2021 - Review Complete 03/11/2021  Allergen Reaction Noted   Adipex-p [phentermine hcl]  10/24/2012   Vasotec [enalapril]  10/24/2012    Family History  Problem Relation Age of Onset   Cancer Mother        Breast   Hypertension Mother    Heart attack Father     Social History   Socioeconomic History   Marital status: Married    Spouse name: Not on file   Number of children: Not on file   Years of education: Not on file   Highest education level: Not on file  Occupational History   Not on file  Tobacco Use   Smoking status: Never   Smokeless tobacco: Never  Substance and Sexual Activity   Alcohol use: Not on file   Drug use: Not on file   Sexual activity: Not on file  Other Topics Concern  Not on file  Social History Narrative   Not on file   Social Determinants of Health   Financial Resource Strain: Not on file  Food Insecurity: Not on file  Transportation Needs: Not on file  Physical Activity: Not on file  Stress: Not on file  Social Connections: Not on file  Intimate Partner Violence: Not on file    Review of Systems: See HPI, otherwise negative ROS  Physical Exam: Vital signs in last 24 hours:     General:   Alert,  Well-developed, well-nourished, pleasant and cooperative in NAD Head:  Normocephalic and atraumatic. Eyes:  Sclera clear, no icterus.   Conjunctiva pink. Ears:  Normal auditory acuity. Nose:  No deformity, discharge,  or lesions. Mouth:  No deformity or lesions, dentition normal. Neck:  Supple;  no masses or thyromegaly. Lungs:  Clear throughout to auscultation.   No wheezes, crackles, or rhonchi. No acute distress. Heart:  Regular rate and rhythm; no murmurs, clicks, rubs,  or gallops. Abdomen:  Soft, nontender and nondistended. No masses, hepatosplenomegaly or hernias noted. Normal bowel sounds, without guarding, and without rebound.   Msk:  Symmetrical without gross deformities. Normal posture. Extremities:  Without clubbing or edema. Neurologic:  Alert and  oriented x4;  grossly normal neurologically. Skin:  Intact without significant lesions or rashes. Cervical Nodes:  No significant cervical adenopathy. Psych:  Alert and cooperative. Normal mood and affect.  Impression/Plan: Tristan Mccarty is here for a colonoscopy to be performed for colon cancer screening purposes.  The risks of the procedure including infection, bleed, or perforation as well as benefits, limitations, alternatives and imponderables have been reviewed with the patient. Questions have been answered. All parties agreeable.

## 2021-04-03 NOTE — Anesthesia Procedure Notes (Signed)
Date/Time: 04/03/2021 2:17 PM Performed by: Orlie Dakin, CRNA Pre-anesthesia Checklist: Patient identified, Emergency Drugs available, Suction available and Patient being monitored Patient Re-evaluated:Patient Re-evaluated prior to induction Oxygen Delivery Method: Nasal cannula Induction Type: IV induction Placement Confirmation: positive ETCO2

## 2021-04-03 NOTE — Telephone Encounter (Signed)
Patient needs repeat colonoscopy in 3 to 6 months for poor colon preparation today.  I do not think he followed his instructions appropriately either as he notes eating chicken wings and cereal yesterday.  Thank you

## 2021-04-03 NOTE — Anesthesia Preprocedure Evaluation (Signed)
Anesthesia Evaluation  Patient identified by MRN, date of birth, ID band Patient awake    Reviewed: Allergy & Precautions, H&P , NPO status , Patient's Chart, lab work & pertinent test results, reviewed documented beta blocker date and time   Airway Mallampati: II  TM Distance: >3 FB Neck ROM: full    Dental no notable dental hx.    Pulmonary neg pulmonary ROS,    Pulmonary exam normal breath sounds clear to auscultation       Cardiovascular Exercise Tolerance: Good hypertension, negative cardio ROS   Rhythm:regular Rate:Normal     Neuro/Psych negative neurological ROS  negative psych ROS   GI/Hepatic negative GI ROS, Neg liver ROS,   Endo/Other  negative endocrine ROSdiabetes, Type 2  Renal/GU negative Renal ROS  negative genitourinary   Musculoskeletal   Abdominal   Peds  Hematology negative hematology ROS (+)   Anesthesia Other Findings   Reproductive/Obstetrics negative OB ROS                             Anesthesia Physical Anesthesia Plan  ASA: 2  Anesthesia Plan: General   Post-op Pain Management:    Induction:   PONV Risk Score and Plan: Propofol infusion  Airway Management Planned:   Additional Equipment:   Intra-op Plan:   Post-operative Plan:   Informed Consent: I have reviewed the patients History and Physical, chart, labs and discussed the procedure including the risks, benefits and alternatives for the proposed anesthesia with the patient or authorized representative who has indicated his/her understanding and acceptance.     Dental Advisory Given  Plan Discussed with: CRNA  Anesthesia Plan Comments:         Anesthesia Quick Evaluation  

## 2021-04-03 NOTE — Op Note (Signed)
[facility] Patient Name: Tristan Mccarty Procedure Date: 04/03/2021 2:05 PM MRN: [id number] Date of Birth: 1955/10/28 Attending MD: Elon Alas. Abbey Chatters DO CSN: [id number] Age: 66 Admit Type: Outpatient Procedure:                Colonoscopy Indications:              Screening for colorectal malignant neoplasm Providers:                Elon Alas. Abbey Chatters, DO, Lurline Del, RN, Kristine L.                            Risa Grill, Technician Referring MD:              Medicines:                See the Anesthesia note for documentation of the                            administered medications Complications:            No immediate complications. Estimated Blood Loss:     Estimated blood loss was minimal. Procedure:                Pre-Anesthesia Assessment:                           - The anesthesia plan was to use monitored                            anesthesia care (MAC).                           After obtaining informed consent, the colonoscope                            was passed under direct vision. Throughout the                            procedure, the patient's blood pressure, pulse, and                            oxygen saturations were monitored continuously. The                            [id number]-HQ190L ([id number]) scope was introduced through                            the anus and advanced to the the cecum, identified                            by appendiceal orifice and ileocecal valve. The                            colonoscopy was performed without difficulty. The                            patient tolerated the procedure  well. The quality                            of the bowel preparation was evaluated using the                            BBPS [facility] Bowel Preparation Scale) with scores                            of: Right Colon = 1 (portion of mucosa seen, but                            other areas not well seen due to staining, residual                            stool  and/or opaque liquid), Transverse Colon = 1                            (portion of mucosa seen, but other areas not well                            seen due to staining, residual stool and/or opaque                            liquid) and Left Colon = 2 (minor amount of                            residual staining, small fragments of stool and/or                            opaque liquid, but mucosa seen well). The total                            BBPS score equals 4. The quality of the bowel                            preparation was inadequate. Scope In: 2:16:45 PM Scope Out: 2:30:51 PM Scope Withdrawal Time: 0 hours 11 minutes 23 seconds  Total Procedure Duration: 0 hours 14 minutes 6 seconds  Findings:      The perianal and digital rectal examinations were normal.      Non-bleeding internal hemorrhoids were found during endoscopy.      A 8 mm polyp was found in the ascending colon. The polyp was sessile.       The polyp was removed with a cold snare. Resection was complete, but the       polyp tissue was NOT retrieved.      Extensive amounts of stool was found in the entire colon, precluding       visualization. Lavage of the area was performed using copious amounts of       sterile water, resulting in incomplete clearance with continued poor       visualization. Impression:               - Preparation of the  colon was inadequate.                           - Non-bleeding internal hemorrhoids.                           - One 8 mm polyp in the ascending colon, removed                            with a cold snare. Complete resection. Polyp tissue                            not retrieved.                           - Stool in the entire examined colon. Moderate Sedation:      Per Anesthesia Care Recommendation:           - Patient has a contact number available for                            emergencies. The signs and symptoms of potential                            delayed complications  were discussed with the                            patient. Return to normal activities tomorrow.                            Written discharge instructions were provided to the                            patient.                           - Resume previous diet.                           - Continue present medications.                           - Await pathology results.                           - Repeat colonoscopy in 3-6 months because the                            bowel preparation was suboptimal.                           - Return to GI clinic PRN. Procedure Code(s):        --- Professional ---                           [phone], Colonoscopy, flexible; with removal of  tumor(s), polyp(s), or other lesion(s) by snare                            technique Diagnosis Code(s):        --- Professional ---                           Z12.11, Encounter for screening for malignant                            neoplasm of colon                           K64.8, Other hemorrhoids                           K63.5, Polyp of colon CPT copyright 2019 American Medical Association. All rights reserved. The codes documented in this report are preliminary and upon coder review may  be revised to meet current compliance requirements. Elon Alas. Abbey Chatters, DO [place] Abbey Chatters, DO 04/03/2021 2:34:29 PM This report has been signed electronically. Number of Addenda: 0

## 2021-04-03 NOTE — Discharge Instructions (Addendum)
  Colonoscopy Discharge Instructions  Read the instructions outlined below and refer to this sheet in the next few weeks. These discharge instructions provide you with general information on caring for yourself after you leave the hospital. Your doctor may also give you specific instructions. While your treatment has been planned according to the most current medical practices available, unavoidable complications occasionally occur.   ACTIVITY You may resume your regular activity, but move at a slower pace for the next 24 hours.  Take frequent rest periods for the next 24 hours.  Walking will help get rid of the air and reduce the bloated feeling in your belly (abdomen).  No driving for 24 hours (because of the medicine (anesthesia) used during the test).   Do not sign any important legal documents or operate any machinery for 24 hours (because of the anesthesia used during the test).  NUTRITION Drink plenty of fluids.  You may resume your normal diet as instructed by your doctor.  Begin with a light meal and progress to your normal diet. Heavy or fried foods are harder to digest and may make you feel sick to your stomach (nauseated).  Avoid alcoholic beverages for 24 hours or as instructed.  MEDICATIONS You may resume your normal medications unless your doctor tells you otherwise.  WHAT YOU CAN EXPECT TODAY Some feelings of bloating in the abdomen.  Passage of more gas than usual.  Spotting of blood in your stool or on the toilet paper.  IF YOU HAD POLYPS REMOVED DURING THE COLONOSCOPY: No aspirin products for 7 days or as instructed.  No alcohol for 7 days or as instructed.  Eat a soft diet for the next 24 hours.  FINDING OUT THE RESULTS OF YOUR TEST Not all test results are available during your visit. If your test results are not back during the visit, make an appointment with your caregiver to find out the results. Do not assume everything is normal if you have not heard from your  caregiver or the medical facility. It is important for you to follow up on all of your test results.  SEEK IMMEDIATE MEDICAL ATTENTION IF: You have more than a spotting of blood in your stool.  Your belly is swollen (abdominal distention).  You are nauseated or vomiting.  You have a temperature over 101.  You have abdominal pain or discomfort that is severe or gets worse throughout the day.   Unfortunately the right side your colon was not cleaned out appropriately.  I did remove 1 polyp in your colon though there are likely more that I could not see due to the amount of stool.  Would recommend repeat colonoscopy in 3 to 6 months with extended colon preparation.  We will call you set this up next week.  I hope you have a great rest of your week!  Elon Alas. Abbey Chatters, D.O. Gastroenterology and Hepatology [facility]

## 2021-04-03 NOTE — Transfer of Care (Signed)
Immediate Anesthesia Transfer of Care Note  Patient: Tristan Mccarty  Procedure(s) Performed: COLONOSCOPY WITH PROPOFOL POLYPECTOMY  Patient Location: Endoscopy Unit  Anesthesia Type:General  Level of Consciousness: awake, alert  and oriented  Airway & Oxygen Therapy: Patient Spontanous Breathing  Post-op Assessment: Report given to RN and Post -op Vital signs reviewed and stable  Post vital signs: Reviewed and stable  Last Vitals:  Vitals Value Taken Time  BP    Temp    Pulse    Resp    SpO2      Last Pain:  Vitals:   04/03/21 1327  TempSrc: Oral  PainSc: 0-No pain      Patients Stated Pain Goal: 6 (36/64/40 3474)  Complications: No notable events documented.

## 2021-04-06 ENCOUNTER — Encounter (HOSPITAL_COMMUNITY): Payer: Self-pay | Admitting: Internal Medicine

## 2021-04-06 NOTE — Telephone Encounter (Signed)
Dr. Abbey Chatters:  Would pt need ov or ok for me to triage?

## 2021-04-06 NOTE — Telephone Encounter (Signed)
Pt was triaged. Routing to [employer]

## 2021-04-08 NOTE — Telephone Encounter (Signed)
Spoke to pt.  He was informed that he needed nurse visit in 3 to 6 months from now.  Pt scheduled ov for 07/09/2021 at 4:00 for nurse visit.

## 2021-04-08 NOTE — Telephone Encounter (Signed)
Okay to triage, thank you

## 2021-04-08 NOTE — Telephone Encounter (Signed)
Lmom for pt to call me back. 

## 2021-04-20 ENCOUNTER — Other Ambulatory Visit: Payer: Self-pay | Admitting: Family Medicine

## 2021-04-20 DIAGNOSIS — M792 Neuralgia and neuritis, unspecified: Secondary | ICD-10-CM

## 2021-05-17 ENCOUNTER — Other Ambulatory Visit: Payer: Self-pay | Admitting: Family Medicine

## 2021-05-17 DIAGNOSIS — E782 Mixed hyperlipidemia: Secondary | ICD-10-CM

## 2021-05-17 DIAGNOSIS — E119 Type 2 diabetes mellitus without complications: Secondary | ICD-10-CM

## 2021-05-17 DIAGNOSIS — B356 Tinea cruris: Secondary | ICD-10-CM

## 2021-05-21 ENCOUNTER — Other Ambulatory Visit: Payer: Self-pay | Admitting: *Deleted

## 2021-05-21 MED ORDER — GLUCOSE BLOOD VI STRP: ORAL_STRIP | 3 refills | Status: DC

## 2021-06-28 ENCOUNTER — Other Ambulatory Visit: Payer: Self-pay | Admitting: Family Medicine

## 2021-06-28 DIAGNOSIS — B356 Tinea cruris: Secondary | ICD-10-CM

## 2021-06-28 DIAGNOSIS — E119 Type 2 diabetes mellitus without complications: Secondary | ICD-10-CM

## 2021-06-30 NOTE — Telephone Encounter (Signed)
Sent my chart message 06/30/21

## 2021-07-01 NOTE — Telephone Encounter (Signed)
Has appointment on 07/06/21

## 2021-07-02 ENCOUNTER — Other Ambulatory Visit: Payer: Self-pay | Admitting: Family Medicine

## 2021-07-02 DIAGNOSIS — E119 Type 2 diabetes mellitus without complications: Secondary | ICD-10-CM

## 2021-07-02 MED ORDER — DAPAGLIFLOZIN PROPANEDIOL 10 MG PO TABS: 10.0000 mg | ORAL_TABLET | Freq: Every day | ORAL | 1 refills | Status: DC

## 2021-07-06 ENCOUNTER — Other Ambulatory Visit: Payer: Self-pay

## 2021-07-06 ENCOUNTER — Ambulatory Visit (INDEPENDENT_AMBULATORY_CARE_PROVIDER_SITE_OTHER): Payer: BC Managed Care – PPO | Admitting: Family Medicine

## 2021-07-06 VITALS — BP 151/83 | HR 81 | Temp 97.4°F | Ht 67.0 in | Wt 195.0 lb

## 2021-07-06 DIAGNOSIS — E782 Mixed hyperlipidemia: Secondary | ICD-10-CM

## 2021-07-06 DIAGNOSIS — E119 Type 2 diabetes mellitus without complications: Secondary | ICD-10-CM | POA: Diagnosis not present

## 2021-07-06 DIAGNOSIS — Z130 Encounter for screening for diseases of the blood and blood-forming organs and certain disorders involving the immune mechanism: Secondary | ICD-10-CM

## 2021-07-06 DIAGNOSIS — I10 Essential (primary) hypertension: Secondary | ICD-10-CM

## 2021-07-06 DIAGNOSIS — Z125 Encounter for screening for malignant neoplasm of prostate: Secondary | ICD-10-CM

## 2021-07-06 DIAGNOSIS — K219 Gastro-esophageal reflux disease without esophagitis: Secondary | ICD-10-CM | POA: Insufficient documentation

## 2021-07-06 DIAGNOSIS — N400 Enlarged prostate without lower urinary tract symptoms: Secondary | ICD-10-CM | POA: Insufficient documentation

## 2021-07-06 MED ORDER — TRULICITY 0.75 MG/0.5ML ~~LOC~~ SOAJ: 0.7500 mg | SUBCUTANEOUS | 0 refills | Status: DC

## 2021-07-06 NOTE — Patient Instructions (Signed)
Stop [place].   Start Trulicity.  Labs today.  Follow up in 3 months.

## 2021-07-06 NOTE — Progress Notes (Signed)
Subjective:  Patient ID: Tristan Mccarty, male    DOB: 21-Dec-1955  Age: 66 y.o. MRN: [id number]  CC: Chief Complaint  Patient presents with   Hypertension   Diabetes    Request for pill form med for yeast possibly caused by farxiga per patient    HPI:  66 year old male with hypertension, GERD, type 2 diabetes, BPH, hyperlipidemia.  Type 2 diabetes In need of a foot exam. Unsure of control.  Last A1c was 7.6. Patient is currently on metformin, glipizide, and [place].  Patient experiencing side effects from [place] as he has yeast infection in the groin approximately every 3 months.  He would like to discuss change in therapy today.  Hyperlipidemia Last LDL was at goal.  He is currently on simvastatin.  Needs labs.  Hypertension Has been stable.  However, BP elevated today.  Patient endorses underlying stress. He is currently on losartan and amlodipine.  Patient Active Problem List   Diagnosis Date Noted   GERD (gastroesophageal reflux disease) 07/06/2021   BPH (benign prostatic hyperplasia) 07/06/2021   Hyperlipidemia 05/13/2013   Essential hypertension, benign 05/13/2013   Type 2 diabetes mellitus without complications ([place]) 20/25/4270    Social Hx   Social History   Socioeconomic History   Marital status: Married    Spouse name: Not on file   Number of children: Not on file   Years of education: Not on file   Highest education level: Not on file  Occupational History   Not on file  Tobacco Use   Smoking status: Never   Smokeless tobacco: Never  Vaping Use   Vaping Use: Never used  Substance and Sexual Activity   Alcohol use: Not Currently   Drug use: Not Currently   Sexual activity: Not on file  Other Topics Concern   Not on file  Social History Narrative   Not on file   Social Determinants of Health   Financial Resource Strain: Not on file  Food Insecurity: Not on file  Transportation Needs: Not on file  Physical Activity: Not on file  Stress: Not  on file  Social Connections: Not on file    Review of Systems  Constitutional: Negative.   Respiratory: Negative.    Cardiovascular: Negative.     Objective:  BP (!) 151/83    Pulse 81    Temp (!) 97.4 F (36.3 C)    Ht _0  (1.702 m)    Wt 195 lb (88.5 kg)    SpO2 96%    BMI 30.54 kg/m   BP/Weight 07/06/2021 04/03/2021 62/37/6283  Systolic BP 151 761 -  Diastolic BP 83 65 -  Wt. (Lbs) 195 180 186.6  BMI 30.54 28.19 28.37    Physical Exam Constitutional:      General: He is not in acute distress.    Appearance: Normal appearance. He is not ill-appearing.  HENT:     Head: Normocephalic and atraumatic.  Eyes:     General:        Right eye: No discharge.        Left eye: No discharge.     Conjunctiva/sclera: Conjunctivae normal.  Cardiovascular:     Rate and Rhythm: Normal rate and regular rhythm.  Pulmonary:     Effort: Pulmonary effort is normal.     Breath sounds: Normal breath sounds. No wheezing, rhonchi or rales.  Neurological:     Mental Status: He is alert.  Psychiatric:        Mood and  Affect: Mood normal.        Behavior: Behavior normal.  Diabetic Foot Check -  Appearance - no lesions, ulcers or calluses Skin - no unusual pallor or redness Monofilament testing -  Right - Great toe, medial, central, lateral ball and posterior foot intact Left - Great toe, medial, central, lateral ball and posterior foot intact   Lab Results  Component Value Date   WBC 6.9 10/14/2020   HGB 14.4 10/14/2020   HCT 42.8 10/14/2020   PLT 185 10/14/2020   GLUCOSE 141 (H) 01/09/2021   CHOL 143 10/14/2020   TRIG 183 (H) 10/14/2020   HDL 44 10/14/2020   LDLCALC 68 10/14/2020   ALT 17 01/09/2021   AST 15 01/09/2021   NA 139 01/09/2021   K 5.1 01/09/2021   CL 102 01/09/2021   CREATININE 0.83 01/09/2021   BUN 16 01/09/2021   CO2 25 01/09/2021   PSA 1.54 02/11/2014   HGBA1C 7.6 (H) 01/09/2021   MICROALBUR 0.5 06/29/2014     Assessment & Plan:   Problem List Items  Addressed This Visit       Cardiovascular and Mediastinum   Essential hypertension, benign    BP mildly elevated today.  We will continue monitor closely.  Continue losartan.        Endocrine   Type 2 diabetes mellitus without complications (HCC) - Primary    A1c today.  Continue metformin and glipizide.  Stopping Farxiga.  Starting Trulicity.      Relevant Medications   Dulaglutide (TRULICITY) 5.73 AQ/5.6HC SOPN   Other Relevant Orders   CMP14+EGFR   Hemoglobin A1c     Other   Hyperlipidemia    Lipid panel today.  Continue simvastatin.      Relevant Orders   Lipid panel   Other Visit Diagnoses     Screening for deficiency anemia       Relevant Orders   CBC   Prostate cancer screening       Relevant Orders   PSA       Meds ordered this encounter  Medications   Dulaglutide (TRULICITY) 0.91 ZC/0.2CH SOPN    Sig: Inject 0.75 mg into the skin once a week.    Dispense:  3 mL    Refill:  0    Follow-up:  Return in about 3 months (around 10/03/2021).  [place]

## 2021-07-06 NOTE — Assessment & Plan Note (Signed)
BP mildly elevated today.  We will continue monitor closely.  Continue losartan.

## 2021-07-06 NOTE — Assessment & Plan Note (Signed)
Lipid panel today.  Continue simvastatin.

## 2021-07-06 NOTE — Assessment & Plan Note (Signed)
A1c today.  Continue metformin and glipizide.  Stopping Farxiga.  Starting Trulicity.

## 2021-07-07 LAB — LIPID PANEL
Chol/HDL Ratio: 3.3 ratio (ref 0.0–5.0)
Cholesterol, Total: 161 mg/dL (ref 100–199)
HDL: 49 mg/dL (ref >39)
LDL Chol Calc (NIH): 83 mg/dL (ref 0–99)
Triglycerides: 171 mg/dL — ABNORMAL HIGH (ref 0–149)
VLDL Cholesterol Cal: 29 mg/dL (ref 5–40)

## 2021-07-07 LAB — CBC
Hematocrit: 44.6 % (ref 37.5–51.0)
Hemoglobin: 15.5 g/dL (ref 13.0–17.7)
MCH: 31.1 pg (ref 26.6–33.0)
MCHC: 34.8 g/dL (ref 31.5–35.7)
MCV: 90 fL (ref 79–97)
Platelets: 157 10*3/uL (ref 150–450)
RBC: 4.98 x10E6/uL (ref 4.14–5.80)
RDW: 12 % (ref 11.6–15.4)
WBC: 7.1 10*3/uL (ref 3.4–10.8)

## 2021-07-07 LAB — HEMOGLOBIN A1C
Est. average glucose Bld gHb Est-mCnc: 177 mg/dL
Hgb A1c MFr Bld: 7.8 % — ABNORMAL HIGH (ref 4.8–5.6)

## 2021-07-07 LAB — CMP14+EGFR
ALT: 12 IU/L (ref 0–44)
AST: 13 IU/L (ref 0–40)
Albumin/Globulin Ratio: 2.2 (ref 1.2–2.2)
Albumin: 5 g/dL — ABNORMAL HIGH (ref 3.8–4.8)
Alkaline Phosphatase: 66 IU/L (ref 44–121)
BUN/Creatinine Ratio: 22 (ref 10–24)
BUN: 14 mg/dL (ref 8–27)
Bilirubin Total: 0.3 mg/dL (ref 0.0–1.2)
CO2: 20 mmol/L (ref 20–29)
Calcium: 9.5 mg/dL (ref 8.6–10.2)
Chloride: 101 mmol/L (ref 96–106)
Creatinine, Ser: 0.64 mg/dL — ABNORMAL LOW (ref 0.76–1.27)
Globulin, Total: 2.3 g/dL (ref 1.5–4.5)
Glucose: 131 mg/dL — ABNORMAL HIGH (ref 70–99)
Potassium: 4 mmol/L (ref 3.5–5.2)
Sodium: 139 mmol/L (ref 134–144)
Total Protein: 7.3 g/dL (ref 6.0–8.5)
eGFR: 106 mL/min/{1.73_m2} (ref >59)

## 2021-07-07 LAB — PSA: Prostate Specific Ag, Serum: 3.5 ng/mL (ref 0.0–4.0)

## 2021-07-08 ENCOUNTER — Other Ambulatory Visit: Payer: Self-pay

## 2021-07-08 DIAGNOSIS — N400 Enlarged prostate without lower urinary tract symptoms: Secondary | ICD-10-CM

## 2021-07-09 ENCOUNTER — Other Ambulatory Visit: Payer: Self-pay

## 2021-07-09 ENCOUNTER — Ambulatory Visit (INDEPENDENT_AMBULATORY_CARE_PROVIDER_SITE_OTHER): Payer: Self-pay | Admitting: *Deleted

## 2021-07-09 VITALS — Ht 68.0 in | Wt 190.0 lb

## 2021-07-09 DIAGNOSIS — Z1211 Encounter for screening for malignant neoplasm of colon: Secondary | ICD-10-CM

## 2021-07-09 NOTE — Progress Notes (Signed)
Gastroenterology Pre-Procedure Review  Request Date: 07/09/2021 Requesting Physician: 3-6 month recall due to poor colon preparation, Last TCS done 04/03/2021 by Dr. Abbey Chatters, internal hemorrhoids  PATIENT REVIEW QUESTIONS: The patient responded to the following health history questions as indicated:    1. Diabetes Melitis: yes, type II  2. Joint replacements in the past 12 months: no 3. Major health problems in the past 3 months: no 4. Has an artificial valve or MVP: no 5. Has a defibrillator: no 6. Has been advised in past to take antibiotics in advance of a procedure like teeth cleaning: no 7. Family history of colon cancer: no  8. Alcohol Use: no 9. Illicit drug Use: no 10. History of sleep apnea: no  11. History of coronary artery or other vascular stents placed within the last 12 months: no 12. History of any prior anesthesia complications: no 13. Body mass index is 28.89 kg/m.    MEDICATIONS & ALLERGIES:    Patient reports the following regarding taking any blood thinners:   Plavix? no Aspirin? no Coumadin? no Brilinta? no Xarelto? no Eliquis? no Pradaxa? no Savaysa? no Effient? no  Patient confirms/reports the following medications:  Current Outpatient Medications  Medication Sig Dispense Refill   amLODipine (NORVASC) 5 MG tablet Take 1 tablet (5 mg total) by mouth 2 (two) times daily. 180 tablet 1   Dulaglutide (TRULICITY) 5.18 AC/1.6SA SOPN Inject 0.75 mg into the skin once a week. 3 mL 0   gabapentin (NEURONTIN) 300 MG capsule Take 1-2 capsules (300-600 mg total) by mouth See admin instructions. Take 1 tablet in the morning and 2 tablets at night. 270 capsule 1   glipiZIDE (GLUCOTROL) 5 MG tablet TAKE 2 TABLETS (10 MG TOTAL) BY MOUTH 2 (TWO) TIMES DAILY BEFORE A MEAL. 360 tablet 0   Ibuprofen-diphenhydrAMINE HCl (ADVIL PM) 200-25 MG CAPS Take 2 tablets by mouth at bedtime.     losartan (COZAAR) 100 MG tablet Take 1 tablet (100 mg total) by mouth daily. 90 tablet 1    metFORMIN (GLUCOPHAGE) 1000 MG tablet TAKE 1 TABLET BY MOUTH TWICE A DAY 180 tablet 0   pantoprazole (PROTONIX) 40 MG tablet Take 1 tablet (40 mg total) by mouth daily. (Patient taking differently: Take 40 mg by mouth daily as needed (acid reflux).) 90 tablet 1   simvastatin (ZOCOR) 40 MG tablet TAKE 1 TABLET BY MOUTH EVERY DAY (Patient taking differently: daily at 6 (six) AM.) 90 tablet 0   tamsulosin (FLOMAX) 0.4 MG CAPS capsule Take 1 capsule (0.4 mg total) by mouth daily. (Patient taking differently: Take 0.4 mg by mouth every evening.) 90 capsule 1   No current facility-administered medications for this visit.    Patient confirms/reports the following allergies:  Allergies  Allergen Reactions   Adipex-P [Phentermine Hcl]     Upset stomach   Vasotec [Enalapril]     Abdominal pain    No orders of the defined types were placed in this encounter.   AUTHORIZATION INFORMATION Primary Insurance: [place],  [place] #: [id number],  Group #: [id number] Pre-Cert / Josem Kaufmann required:  Pre-Cert / Auth #:   SCHEDULE INFORMATION: Procedure has been scheduled as follows:  Date: , Time:   Location: [facility] with Dr. Abbey Chatters  This Gastroenterology Pre-Precedure Review Form is being routed to the following provider(s): Aliene Altes, PA-C

## 2021-07-09 NOTE — Progress Notes (Signed)
Needs OV due to poor prep previously.

## 2021-07-11 ENCOUNTER — Other Ambulatory Visit: Payer: Self-pay | Admitting: Family Medicine

## 2021-07-11 DIAGNOSIS — E119 Type 2 diabetes mellitus without complications: Secondary | ICD-10-CM

## 2021-07-13 NOTE — Progress Notes (Signed)
Spoke to pt.  Informed him that he needed ov due to poor prep.  Scheduled ov for 07/20/2021 at 4:00 virtually.

## 2021-07-20 ENCOUNTER — Other Ambulatory Visit: Payer: Self-pay

## 2021-07-20 ENCOUNTER — Ambulatory Visit (INDEPENDENT_AMBULATORY_CARE_PROVIDER_SITE_OTHER): Payer: BC Managed Care – PPO | Admitting: Urology

## 2021-07-20 ENCOUNTER — Telehealth (INDEPENDENT_AMBULATORY_CARE_PROVIDER_SITE_OTHER): Payer: BC Managed Care – PPO | Admitting: Gastroenterology

## 2021-07-20 ENCOUNTER — Encounter: Payer: Self-pay | Admitting: Urology

## 2021-07-20 ENCOUNTER — Encounter: Payer: Self-pay | Admitting: Gastroenterology

## 2021-07-20 ENCOUNTER — Telehealth: Payer: Self-pay | Admitting: *Deleted

## 2021-07-20 VITALS — BP 136/71 | HR 90

## 2021-07-20 DIAGNOSIS — R972 Elevated prostate specific antigen [PSA]: Secondary | ICD-10-CM | POA: Diagnosis not present

## 2021-07-20 DIAGNOSIS — N529 Male erectile dysfunction, unspecified: Secondary | ICD-10-CM

## 2021-07-20 DIAGNOSIS — K5900 Constipation, unspecified: Secondary | ICD-10-CM | POA: Diagnosis not present

## 2021-07-20 DIAGNOSIS — R3912 Poor urinary stream: Secondary | ICD-10-CM | POA: Diagnosis not present

## 2021-07-20 DIAGNOSIS — Z1211 Encounter for screening for malignant neoplasm of colon: Secondary | ICD-10-CM | POA: Diagnosis not present

## 2021-07-20 DIAGNOSIS — N401 Enlarged prostate with lower urinary tract symptoms: Secondary | ICD-10-CM | POA: Diagnosis not present

## 2021-07-20 DIAGNOSIS — N402 Nodular prostate without lower urinary tract symptoms: Secondary | ICD-10-CM

## 2021-07-20 DIAGNOSIS — N138 Other obstructive and reflux uropathy: Secondary | ICD-10-CM

## 2021-07-20 MED ORDER — TRULANCE 3 MG PO TABS: 3.0000 mg | ORAL_TABLET | Freq: Every day | ORAL | 5 refills | Status: DC

## 2021-07-20 MED ORDER — SILDENAFIL CITRATE 20 MG PO TABS: 20.0000 mg | ORAL_TABLET | Freq: Every day | ORAL | 11 refills | Status: DC

## 2021-07-20 NOTE — H&P (View-Only) (Signed)
? ? ? ? ?Primary Care Physician:  Coral Spikes, DO ?Primary GI:  Elon Alas. Abbey Chatters, DO ?Patient Location: Home ?Provider Location: [place] office ? ?Reason for Visit:  ?Chief Complaint  ?Patient presents with  ? Colonoscopy  ?   ?  ? ?Persons present on the virtual encounter, with roles: Patient, myself (provider),Tammy Conger, CMA (updated meds and allergies) ? ?Total time (minutes) spent on medical discussion: 12 minutes ? ?Due to COVID-19, visit was conducted using Mychart video method.  Visit was requested by patient. ? ?Virtual Visit via Mychart video ? ?I connected with Tristan Mccarty on 07/20/21 at  4:00 PM EST by Mychart video  and verified that I am speaking with the correct person using two identifiers. ?  ?I discussed the limitations, risks, security and privacy concerns of performing an evaluation and management service by telephone/video and the availability of in person appointments. I also discussed with the patient that there may be a patient responsible charge related to this service. The patient expressed understanding and agreed to proceed. ? ? ?HPI:   ?Tristan Mccarty is a 66 y.o. male who presents for virtual visit regarding to reschedule colonoscopy. Patient here for 3-6 month recall for colonoscopy due to poor colon prep in 03/2021. Attempted colonoscopy 03/2021, prep inadequate. 32m polyp removed but tissue not retrieved. Internal hemorrhoids.  ? ?Patient states he completed bowel prep as per instructions provided. He had Suprep, split dose with second dose the morning of his procedure. He felt like he was having adequate bowel movements and was getting clear.  At the time he was having regular bowel movements, "like clockwork".  ? ?He started Trulicity one week ago.  Also off FIran  Now his bowels are completely "screwed up".  No longer having regular BMs.  BMs feel incomplete.  Has had to take Dulcolax to make them work.  Still did not feel like he got good results.  No melena or rectal  bleeding.  No abdominal pain.  No upper GI symptoms.   ?  ? ?Current Outpatient Medications  ?Medication Sig Dispense Refill  ? amLODipine (NORVASC) 5 MG tablet Take 1 tablet (5 mg total) by mouth 2 (two) times daily. 180 tablet 1  ? Dulaglutide (TRULICITY) 02.99MME/2.6STSOPN Inject 0.75 mg into the skin once a week. 3 mL 0  ? gabapentin (NEURONTIN) 300 MG capsule Take 1-2 capsules (300-600 mg total) by mouth See admin instructions. Take 1 tablet in the morning and 2 tablets at night. 270 capsule 1  ? glipiZIDE (GLUCOTROL) 5 MG tablet TAKE 2 TABLETS (10 MG TOTAL) BY MOUTH 2 (TWO) TIMES DAILY BEFORE A MEAL. 360 tablet 0  ? Ibuprofen-diphenhydrAMINE HCl (ADVIL PM) 200-25 MG CAPS Take 2 tablets by mouth at bedtime.    ? losartan (COZAAR) 100 MG tablet Take 1 tablet (100 mg total) by mouth daily. 90 tablet 1  ? metFORMIN (GLUCOPHAGE) 1000 MG tablet TAKE 1 TABLET BY MOUTH TWICE A DAY 180 tablet 0  ? pantoprazole (PROTONIX) 40 MG tablet Take 1 tablet (40 mg total) by mouth daily. (Patient taking differently: Take 40 mg by mouth daily as needed (acid reflux).) 90 tablet 1  ? sildenafil (REVATIO) 20 MG tablet Take 1 tablet (20 mg total) by mouth daily. Take 1-5 tablets as needed 30 tablet 11  ? simvastatin (ZOCOR) 40 MG tablet TAKE 1 TABLET BY MOUTH EVERY DAY (Patient taking differently: daily at 6 (six) AM.) 90 tablet 0  ? tamsulosin (FLOMAX) 0.4  MG CAPS capsule TAKE 1 CAPSULE BY MOUTH EVERY DAY 90 capsule 1  ? ?No current facility-administered medications for this visit.  ? ? ?Past Medical History:  ?Diagnosis Date  ? Hyperlipidemia   ? Hypertension   ? Reflux   ? Type 2 diabetes mellitus ([place])   ? ? ?Past Surgical History:  ?Procedure Laterality Date  ? COLONOSCOPY    ? COLONOSCOPY WITH PROPOFOL N/A 04/03/2021  ? Procedure: COLONOSCOPY WITH PROPOFOL;  Surgeon: Eloise Harman, DO;  [facility];  Service: Endoscopy;  Laterality: N/A;  2:00 / ASA II  ? EYE SURGERY    ? POLYPECTOMY  04/03/2021  ? Procedure:  POLYPECTOMY;  Surgeon: Eloise Harman, DO;  [facility]: [facility];  Service: Endoscopy;;  ascending  ? ? ?Family History  ?Problem Relation Age of Onset  ? Cancer Mother   ?     Breast  ? Hypertension Mother   ? Heart attack Father   ? Colon cancer Neg Hx   ? ? ?Social History  ? ?Socioeconomic History  ? Marital status: Married  ?  Spouse name: Not on file  ? Number of children: Not on file  ? Years of education: Not on file  ? Highest education level: Not on file  ?Occupational History  ? Not on file  ?Tobacco Use  ? Smoking status: Never  ? Smokeless tobacco: Never  ?Vaping Use  ? Vaping Use: Never used  ?Substance and Sexual Activity  ? Alcohol use: Not Currently  ? Drug use: Not Currently  ? Sexual activity: Yes  ?Other Topics Concern  ? Not on file  ?Social History Narrative  ? Not on file  ? ?Social Determinants of Health  ? ?Financial Resource Strain: Not on file  ?Food Insecurity: Not on file  ?Transportation Needs: Not on file  ?Physical Activity: Not on file  ?Stress: Not on file  ?Social Connections: Not on file  ?Intimate Partner Violence: Not on file  ? ? ?  ?ROS: ? ?General: Negative for anorexia, weight loss, fever, chills, fatigue, weakness. ?Eyes: Negative for vision changes.  ?ENT: Negative for hoarseness, difficulty swallowing , nasal congestion. ?CV: Negative for chest pain, angina, palpitations, dyspnea on exertion, peripheral edema.  ?Respiratory: Negative for dyspnea at rest, dyspnea on exertion, cough, sputum, wheezing.  ?GI: See history of present illness. ?GU:  Negative for dysuria, hematuria, urinary incontinence, urinary frequency, nocturnal urination.  ?MS: Negative for joint pain, low back pain.  ?Derm: Negative for rash or itching.  ?Neuro: Negative for weakness, abnormal sensation, seizure, frequent headaches, memory loss, confusion.  ?Psych: Negative for anxiety, depression, suicidal ideation, hallucinations.  ?Endo: Negative for unusual weight change.  ?Heme: Negative for  bruising or bleeding. ?Allergy: Negative for rash or hives. ?  ?Observations/Objective: ? ? ?Well-nourished, well-developed male in no acute distress.  Alert and cooperative.  Otherwise exam unavailable. ? ? ?Lab Results  ?Component Value Date  ? CREATININE 0.64 (L) 07/06/2021  ? BUN 14 07/06/2021  ? NA 139 07/06/2021  ? K 4.0 07/06/2021  ? CL 101 07/06/2021  ? CO2 20 07/06/2021  ? ?Lab Results  ?Component Value Date  ? ALT 12 07/06/2021  ? AST 13 07/06/2021  ? ALKPHOS 66 07/06/2021  ? BILITOT 0.3 07/06/2021  ? ?Lab Results  ?Component Value Date  ? WBC 7.1 07/06/2021  ? HGB 15.5 07/06/2021  ? HCT 44.6 07/06/2021  ? MCV 90 07/06/2021  ? PLT 157 07/06/2021  ? ?Lab Results  ?  Component Value Date  ? HGBA1C 7.8 (H) 07/06/2021  ? ? ?Assessment and Plan: ? ?Encounter for screening colonoscopy: Inadequate prep at time of attempted colonoscopy in November 2022.  He completed bowel prep as prescribed.  Unclear why he was not adequately prepped.  Now he is having bowel irregularity with recent medication changes.  Discussed being proactive, treating constipation with daily regimen.  If his bowels begin to move too much, we can always adjust dose or stop if no longer needed.  However it will be important to make sure bowel movements are regular prior to colonoscopy bowel prep so that we can ensure adequate prep this time. ? ?Colonoscopy in the near future.  ASA 2.  I have discussed the risks, alternatives, benefits with regards to but not limited to the risk of reaction to medication, bleeding, infection, perforation and the patient is agreeable to proceed. Written consent to be obtained. 2 day modified prep provided.  ?Hold Trulicity 1 week prior to colonoscopy. ?Add Trulance 3 mg daily. Patient to let me know if bowels are not moving well prior to colonoscopy.  ? ?Follow Up Instructions: ? ?  ?I discussed the assessment and treatment plan with the patient. The patient was provided an opportunity to ask questions and all were  answered. The patient agreed with the plan and demonstrated an understanding of the instructions. AVS mailed to patient's home address. ?  ?The patient was advised to call back or seek an in-person evaluatio

## 2021-07-20 NOTE — Progress Notes (Signed)
Primary Care Physician:  Coral Spikes, DO Primary GI:  Elon Alas. Abbey Chatters, DO Patient Location: Home Provider Location: [facility] office  Reason for Visit:  Chief Complaint  Patient presents with   Colonoscopy        Persons present on the virtual encounter, with roles: Patient, myself (provider),Tammy Clifton, CMA (updated meds and allergies)  Total time (minutes) spent on medical discussion: 12 minutes  Due to COVID-19, visit was conducted using Mychart video method.  Visit was requested by patient.  Virtual Visit via Mychart video  I connected with Mayme Genta on 07/20/21 at  4:00 PM EST by Mychart video  and verified that I am speaking with the correct person using two identifiers.   I discussed the limitations, risks, security and privacy concerns of performing an evaluation and management service by telephone/video and the availability of in person appointments. I also discussed with the patient that there may be a patient responsible charge related to this service. The patient expressed understanding and agreed to proceed.   HPI:   Tristan Mccarty is a 66 y.o. male who presents for virtual visit regarding to reschedule colonoscopy. Patient here for 3-6 month recall for colonoscopy due to poor colon prep in 03/2021. Attempted colonoscopy 03/2021, prep inadequate. 78m polyp removed but tissue not retrieved. Internal hemorrhoids.   Patient states he completed bowel prep as per instructions provided. He had Suprep, split dose with second dose the morning of his procedure. He felt like he was having adequate bowel movements and was getting clear.  At the time he was having regular bowel movements, "like clockwork".   He started Trulicity one week ago.  Also off FIran  Now his bowels are completely "screwed up".  No longer having regular BMs.  BMs feel incomplete.  Has had to take Dulcolax to make them work.  Still did not feel like he got good results.  No melena or rectal  bleeding.  No abdominal pain.  No upper GI symptoms.      Current Outpatient Medications  Medication Sig Dispense Refill   amLODipine (NORVASC) 5 MG tablet Take 1 tablet (5 mg total) by mouth 2 (two) times daily. 180 tablet 1   Dulaglutide (TRULICITY) 06.83MMH/9.6QISOPN Inject 0.75 mg into the skin once a week. 3 mL 0   gabapentin (NEURONTIN) 300 MG capsule Take 1-2 capsules (300-600 mg total) by mouth See admin instructions. Take 1 tablet in the morning and 2 tablets at night. 270 capsule 1   glipiZIDE (GLUCOTROL) 5 MG tablet TAKE 2 TABLETS (10 MG TOTAL) BY MOUTH 2 (TWO) TIMES DAILY BEFORE A MEAL. 360 tablet 0   Ibuprofen-diphenhydrAMINE HCl (ADVIL PM) 200-25 MG CAPS Take 2 tablets by mouth at bedtime.     losartan (COZAAR) 100 MG tablet Take 1 tablet (100 mg total) by mouth daily. 90 tablet 1   metFORMIN (GLUCOPHAGE) 1000 MG tablet TAKE 1 TABLET BY MOUTH TWICE A DAY 180 tablet 0   pantoprazole (PROTONIX) 40 MG tablet Take 1 tablet (40 mg total) by mouth daily. (Patient taking differently: Take 40 mg by mouth daily as needed (acid reflux).) 90 tablet 1   sildenafil (REVATIO) 20 MG tablet Take 1 tablet (20 mg total) by mouth daily. Take 1-5 tablets as needed 30 tablet 11   simvastatin (ZOCOR) 40 MG tablet TAKE 1 TABLET BY MOUTH EVERY DAY (Patient taking differently: daily at 6 (six) AM.) 90 tablet 0   tamsulosin (FLOMAX) 0.4  MG CAPS capsule TAKE 1 CAPSULE BY MOUTH EVERY DAY 90 capsule 1   No current facility-administered medications for this visit.    Past Medical History:  Diagnosis Date   Hyperlipidemia    Hypertension    Reflux    Type 2 diabetes mellitus ([place])     Past Surgical History:  Procedure Laterality Date   COLONOSCOPY     COLONOSCOPY WITH PROPOFOL N/A 04/03/2021   Procedure: COLONOSCOPY WITH PROPOFOL;  Surgeon: Eloise Harman, DO;  [facility];  Service: Endoscopy;  Laterality: N/A;  2:00 / ASA II   EYE SURGERY     POLYPECTOMY  04/03/2021   Procedure:  POLYPECTOMY;  Surgeon: Eloise Harman, DO;  Location: [facility];  Service: Endoscopy;;  ascending    Family History  Problem Relation Age of Onset   Cancer Mother        Breast   Hypertension Mother    Heart attack Father    Colon cancer Neg Hx     Social History   Socioeconomic History   Marital status: Married    Spouse name: Not on file   Number of children: Not on file   Years of education: Not on file   Highest education level: Not on file  Occupational History   Not on file  Tobacco Use   Smoking status: Never   Smokeless tobacco: Never  Vaping Use   Vaping Use: Never used  Substance and Sexual Activity   Alcohol use: Not Currently   Drug use: Not Currently   Sexual activity: Yes  Other Topics Concern   Not on file  Social History Narrative   Not on file   Social Determinants of Health   Financial Resource Strain: Not on file  Food Insecurity: Not on file  Transportation Needs: Not on file  Physical Activity: Not on file  Stress: Not on file  Social Connections: Not on file  Intimate Partner Violence: Not on file      ROS:  General: Negative for anorexia, weight loss, fever, chills, fatigue, weakness. Eyes: Negative for vision changes.  ENT: Negative for hoarseness, difficulty swallowing , nasal congestion. CV: Negative for chest pain, angina, palpitations, dyspnea on exertion, peripheral edema.  Respiratory: Negative for dyspnea at rest, dyspnea on exertion, cough, sputum, wheezing.  GI: See history of present illness. GU:  Negative for dysuria, hematuria, urinary incontinence, urinary frequency, nocturnal urination.  MS: Negative for joint pain, low back pain.  Derm: Negative for rash or itching.  Neuro: Negative for weakness, abnormal sensation, seizure, frequent headaches, memory loss, confusion.  Psych: Negative for anxiety, depression, suicidal ideation, hallucinations.  Endo: Negative for unusual weight change.  Heme: Negative for  bruising or bleeding. Allergy: Negative for rash or hives.   Observations/Objective:   Well-nourished, well-developed male in no acute distress.  Alert and cooperative.  Otherwise exam unavailable.   Lab Results  Component Value Date   CREATININE 0.64 (L) 07/06/2021   BUN 14 07/06/2021   NA 139 07/06/2021   K 4.0 07/06/2021   CL 101 07/06/2021   CO2 20 07/06/2021   Lab Results  Component Value Date   ALT 12 07/06/2021   AST 13 07/06/2021   ALKPHOS 66 07/06/2021   BILITOT 0.3 07/06/2021   Lab Results  Component Value Date   WBC 7.1 07/06/2021   HGB 15.5 07/06/2021   HCT 44.6 07/06/2021   MCV 90 07/06/2021   PLT 157 07/06/2021   Lab Results  Component Value Date   HGBA1C 7.8 (H) 07/06/2021    Assessment and Plan:  Encounter for screening colonoscopy: Inadequate prep at time of attempted colonoscopy in November 2022.  He completed bowel prep as prescribed.  Unclear why he was not adequately prepped.  Now he is having bowel irregularity with recent medication changes.  Discussed being proactive, treating constipation with daily regimen.  If his bowels begin to move too much, we can always adjust dose or stop if no longer needed.  However it will be important to make sure bowel movements are regular prior to colonoscopy bowel prep so that we can ensure adequate prep this time.  Colonoscopy in the near future.  ASA 2.  I have discussed the risks, alternatives, benefits with regards to but not limited to the risk of reaction to medication, bleeding, infection, perforation and the patient is agreeable to proceed. Written consent to be obtained. 2 day modified prep provided.  Hold Trulicity 1 week prior to colonoscopy. Add Trulance 3 mg daily. Patient to let me know if bowels are not moving well prior to colonoscopy.   Follow Up Instructions:    I discussed the assessment and treatment plan with the patient. The patient was provided an opportunity to ask questions and all were  answered. The patient agreed with the plan and demonstrated an understanding of the instructions. AVS mailed to patient's home address.   The patient was advised to call back or seek an in-person evaluation if the symptoms worsen or if the condition fails to improve as anticipated.  I provided 12 minutes of virtual face-to-face time during this encounter.   Neil Crouch, PA-C

## 2021-07-20 NOTE — Telephone Encounter (Signed)
Pt consented to a virtual visit. 

## 2021-07-20 NOTE — Telephone Encounter (Signed)
Tristan Mccarty, you are scheduled for a virtual visit with your provider today.  Just as we do with appointments in the office, we must obtain your consent to participate.  Your consent will be active for this visit and any virtual visit you may have with one of our providers in the next 365 days.  If you have a MyChart account, I can also send a copy of this consent to you electronically.  All virtual visits are billed to your insurance company just like a traditional visit in the office.  As this is a virtual visit, video technology does not allow for your provider to perform a traditional examination.  This may limit your provider's ability to fully assess your condition.  If your provider identifies any concerns that need to be evaluated in person or the need to arrange testing such as labs, EKG, etc, we will make arrangements to do so.  Although advances in technology are sophisticated, we cannot ensure that it will always work on either your end or our end.  If the connection with a video visit is poor, we may have to switch to a telephone visit.  With either a video or telephone visit, we are not always able to ensure that we have a secure connection.   I need to obtain your verbal consent now.   Are you willing to proceed with your visit today?  ?

## 2021-07-20 NOTE — Progress Notes (Signed)
? ?07/20/2021 ?2:13 PM  ? ?Tristan Mccarty ?[birth year] ?[id number] ? ?Referring provider: Coral Spikes, DO ?[address] ?[address] ?[place],  [place] [address] ? ?No chief complaint on file. ? ? ?HPI: ? ?1) BPH - since 2021. On tamsulosin. No prostate surgery. Some urgency in AM. Noc only 0-1. AUASS = 6.  ? ?2) prostate nodule - noted 5 mm left apical today Mar 2023 on exam. His PSA was 3.5 in Feb 2023 and 2.4 in Aug 2021. No pelvic imaging. An uncle had PCa and treated with the "implants".   ? ?2) ED - trouble getting and maintaining an erection. Since 2017. Never tried anything.  ? 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  ? ?PMH: ?Past Medical History:  ?Diagnosis Date  ? Hyperlipidemia   ? Hypertension   ? Reflux   ? Type 2 diabetes mellitus ([place])   ? ? ?Surgical History: ?Past Surgical History:  ?Procedure Laterality Date  ? COLONOSCOPY    ? COLONOSCOPY WITH PROPOFOL N/A 04/03/2021  ? Procedure: COLONOSCOPY WITH PROPOFOL;  Surgeon: Eloise Harman, DO;  [facility];  Service: Endoscopy;  Laterality: N/A;  2:00 / ASA II  ? EYE SURGERY    ? POLYPECTOMY  04/03/2021  ? Procedure: POLYPECTOMY;  Surgeon: Eloise Harman, DO;  [facility];  Service: Endoscopy;;  ascending  ? ? ?Home Medications:  ?Allergies as of 07/20/2021   ? ?   Reactions  ? Adipex-p [phentermine Hcl]   ? Upset stomach  ? Vasotec [enalapril]   ? Abdominal pain  ? ?  ? ?  ?Medication List  ?  ? ?  ? Accurate as of July 20, 2021  2:13 PM. If you have any questions, ask your nurse or doctor.  ?  ?  ? ?  ? ?Advil PM 200-25 MG Caps ?Generic drug: Ibuprofen-diphenhydrAMINE HCl ?Take 2 tablets by mouth at bedtime. ?  ?amLODipine 5 MG tablet ?Commonly known as: NORVASC ?Take 1 tablet (5 mg total) by mouth 2 (two) times daily. ?  ?gabapentin 300 MG capsule ?Commonly known as: NEURONTIN ?Take 1-2 capsules (300-600 mg total) by mouth See admin instructions. Take 1 tablet in the morning and 2 tablets at night. ?  ?glipiZIDE 5 MG  tablet ?Commonly known as: GLUCOTROL ?TAKE 2 TABLETS (10 MG TOTAL) BY MOUTH 2 (TWO) TIMES DAILY BEFORE A MEAL. ?  ?losartan 100 MG tablet ?Commonly known as: COZAAR ?Take 1 tablet (100 mg total) by mouth daily. ?  ?metFORMIN 1000 MG tablet ?Commonly known as: GLUCOPHAGE ?TAKE 1 TABLET BY MOUTH TWICE A DAY ?  ?pantoprazole 40 MG tablet ?Commonly known as: PROTONIX ?Take 1 tablet (40 mg total) by mouth daily. ?What changed:  ?when to take this ?reasons to take this ?  ?simvastatin 40 MG tablet ?Commonly known as: ZOCOR ?TAKE 1 TABLET BY MOUTH EVERY DAY ?What changed:  ?how much to take ?how to take this ?when to take this ?  ?tamsulosin 0.4 MG Caps capsule ?Commonly known as: FLOMAX ?TAKE 1 CAPSULE BY MOUTH EVERY DAY ?  ?Trulicity 2.44 WN/0.2VO Sopn ?Generic drug: Dulaglutide ?Inject 0.75 mg into the skin once a week. ?  ? ?  ? ? ?Allergies:  ?Allergies  ?Allergen Reactions  ? Adipex-P [Phentermine Hcl]   ?  Upset stomach  ? Vasotec [Enalapril]   ?  Abdominal pain  ? ? ?Family History: ?Family History  ?Problem Relation Age of Onset  ? Cancer Mother   ?  Breast  ? Hypertension Mother   ? Heart attack Father   ? ? ?Social History:  reports that he has never smoked. He has never used smokeless tobacco. He reports that he does not currently use alcohol. He reports that he does not currently use drugs. ? ? ?Physical Exam: ?There were no vitals taken for this visit.  ?Constitutional:  Alert and oriented, No acute distress. ?HEENT: [place] AT, moist mucus membranes.  Trachea midline, no masses. ?Cardiovascular: No clubbing, cyanosis, or edema. ?Respiratory: Normal respiratory effort, no increased work of breathing. ?GI: Abdomen is soft, nontender, nondistended, no abdominal masses ?GU: No CVA tenderness ?Lymph: No cervical or inguinal lymphadenopathy. ?Skin: No rashes, bruises or suspicious lesions. ?Neurologic: Grossly intact, no focal deficits, moving all 4 extremities. ?Psychiatric: Normal mood and affect. ?DRE: Prostate  30 g, he has a 5 mm left apical nodule  ? ?Laboratory Data: ?Lab Results  ?Component Value Date  ? WBC 7.1 07/06/2021  ? HGB 15.5 07/06/2021  ? HCT 44.6 07/06/2021  ? MCV 90 07/06/2021  ? PLT 157 07/06/2021  ? ? ?Lab Results  ?Component Value Date  ? CREATININE 0.64 (L) 07/06/2021  ? ? ?Lab Results  ?Component Value Date  ? PSA 1.54 02/11/2014  ? ? ?No results found for: TESTOSTERONE ? ?Lab Results  ?Component Value Date  ? HGBA1C 7.8 (H) 07/06/2021  ? ? ?Urinalysis ?No results found for: COLORURINE, APPEARANCEUR, [place], [place], [place], [place], [place], KETONESUR, PROTEINUR, [place], NITRITE, LEUKOCYTESUR ? ?Lab Results  ?Component Value Date  ? LABMICR <3.0 10/14/2020  ? ? ? ? ? ?Assessment & Plan:   ? ?Prostate nodule - small and PSA has risen. I had a long discussion with the patient on the nature of elevated PSA and prostate nodule- benign vs malignant causes. We discussed age specific levels and that PCa can be seen on a biopsy with very low PSA levels (<=2.5). We discussed the nature risks and benefits of continued surveillance, other lab tests, imaging as well as prostate biopsy. We discussed the management of prostate cancer might include active surveillance or treatment depending on biopsy findings. All questions answered. Will go ahead and check a prostate MRI.  ? ?BPH - continue tamsulosin  ? ? ?ED - trial of sildenafil - disc nature r/b/a to pde5i.  ? ? ?No follow-ups on file. ? ?Festus Aloe, MD ? ?[facility]  ?[place], [place] [address] ?[phone] ? ? ?

## 2021-07-20 NOTE — Patient Instructions (Signed)
Add Trulance 3 mg daily to manage irregular bowel movements.  If your bowel movements become too frequent or too loose, drop back to every other day dosing. ?Please call if you are not having regular bowel movements a week prior to your colonoscopy.  We can make adjustments in your medications at that time as needed. ?Colonoscopy in the near future.  Please see separate instructions.  You will also have to hold Trulicity for 1 week prior to your colonoscopy because it can interfere with anesthesia. ?

## 2021-07-21 ENCOUNTER — Telehealth: Payer: Self-pay | Admitting: *Deleted

## 2021-07-21 MED ORDER — PEG 3350-KCL-NA BICARB-NACL 420 G PO SOLR: ORAL | 0 refills | Status: DC

## 2021-07-21 NOTE — Telephone Encounter (Signed)
Called pt, LMOVM to call back to schedule TCS with Dr. Abbey Chatters asa 2, SEE ENCOUNTER FORM for special instructions ?

## 2021-07-21 NOTE — Telephone Encounter (Signed)
Pt returned call. Scheduled for TCS with propofol asa 2 on 3/20 at 9:15am (needed a Monday). Aware will send prep instructions. Discussed special prep instructions also.  ?

## 2021-07-28 NOTE — Patient Instructions (Signed)
? ? ? ? ? ? Tristan Mccarty ? 07/28/2021  ?  ? '@PREFPERIOPPHARMACY'$ @ ? ? Your procedure is scheduled on  08/03/2021. ? ? Report to [facility] at  [facility]. ? Call this number if you have problems the morning of surgery: ? [phone] ? ? Remember: ? Follow the diet and prep instructions given to you by the office. ? ?DO NOT take any medications for diabetes the morning of your procedure. ?  ? Take these medicines the morning of surgery with A SIP OF WATER  ? ?amlodipine, gabapentin, protonix, flomax. ?  ? ? Do not wear jewelry, make-up or nail polish. ? Do not wear lotions, powders, or perfumes, or deodorant. ? Do not shave 48 hours prior to surgery.  Men may shave face and neck. ? Do not bring valuables to the hospital. ? [facility] is not responsible for any belongings or valuables. ? ?Contacts, dentures or bridgework may not be worn into surgery.  Leave your suitcase in the car.  After surgery it may be brought to your room. ? ?For patients admitted to the hospital, discharge time will be determined by your treatment team. ? ?Patients discharged the day of surgery will not be allowed to drive home and must have someone with them for 24 hours.  ? ? ?Special instructions:   DO NOT smoke tobacco or vape for 24 hours before your procedure. ? ?Please read over the following fact sheets that you were given. ?Anesthesia Post-op Instructions and Care and Recovery After Surgery ?  ? ? ? Colonoscopy, Adult, Care After ?This sheet gives you information about how to care for yourself after your procedure. Your health care provider may also give you more specific instructions. If you have problems or questions, contact your health care provider. ?What can I expect after the procedure? ?After the procedure, it is common to have: ?A small amount of blood in your stool for 24 hours after the procedure. ?Some gas. ?Mild cramping or bloating of your abdomen. ?Follow these instructions at home: ?Eating and drinking ? ?Drink enough  fluid to keep your urine pale yellow. ?Follow instructions from your health care provider about eating or drinking restrictions. ?Resume your normal diet as instructed by your health care provider. Avoid heavy or fried foods that are hard to digest. ?Activity ?Rest as told by your health care provider. ?Avoid sitting for a long time without moving. Get up to take short walks every 1-2 hours. This is important to improve blood flow and breathing. Ask for help if you feel weak or unsteady. ?Return to your normal activities as told by your health care provider. Ask your health care provider what activities are safe for you. ?Managing cramping and bloating ? ?Try walking around when you have cramps or feel bloated. ?Apply heat to your abdomen as told by your health care provider. Use the heat source that your health care provider recommends, such as a moist heat pack or a heating pad. ?Place a towel between your skin and the heat source. ?Leave the heat on for 20-30 minutes. ?Remove the heat if your skin turns bright red. This is especially important if you are unable to feel pain, heat, or cold. You may have a greater risk of getting burned. ?General instructions ?If you were given a sedative during the procedure, it can affect you for several hours. Do not drive or operate machinery until your health care provider says that it is safe. ?For the first 24  hours after the procedure: ?Do not sign important documents. ?Do not drink alcohol. ?Do your regular daily activities at a slower pace than normal. ?Eat soft foods that are easy to digest. ?Take over-the-counter and prescription medicines only as told by your health care provider. ?Keep all follow-up visits as told by your health care provider. This is important. ?Contact a health care provider if: ?You have blood in your stool 2-3 days after the procedure. ?Get help right away if you have: ?More than a small spotting of blood in your stool. ?Large blood clots in your  stool. ?Swelling of your abdomen. ?Nausea or vomiting. ?A fever. ?Increasing pain in your abdomen that is not relieved with medicine. ?Summary ?After the procedure, it is common to have a small amount of blood in your stool. You may also have mild cramping and bloating of your abdomen. ?If you were given a sedative during the procedure, it can affect you for several hours. Do not drive or operate machinery until your health care provider says that it is safe. ?Get help right away if you have a lot of blood in your stool, nausea or vomiting, a fever, or increased pain in your abdomen. ?This information is not intended to replace advice given to you by your health care provider. Make sure you discuss any questions you have with your health care provider. ?Document Revised: 03/09/2019 Document Reviewed: 11/27/2018 ?Elsevier Patient Education ? [place]. ?Monitored Anesthesia Care, Care After ?This sheet gives you information about how to care for yourself after your procedure. Your health care provider may also give you more specific instructions. If you have problems or questions, contact your health care provider. ?What can I expect after the procedure? ?After the procedure, it is common to have: ?Tiredness. ?Forgetfulness about what happened after the procedure. ?Impaired judgment for important decisions. ?Nausea or vomiting. ?Some difficulty with balance. ?Follow these instructions at home: ?For the time period you were told by your health care provider: ?  ?Rest as needed. ?Do not participate in activities where you could fall or become injured. ?Do not drive or use machinery. ?Do not drink alcohol. ?Do not take sleeping pills or medicines that cause drowsiness. ?Do not make important decisions or sign legal documents. ?Do not take care of children on your own. ?Eating and drinking ?Follow the diet that is recommended by your health care provider. ?Drink enough fluid to keep your urine pale yellow. ?If  you vomit: ?Drink water, juice, or soup when you can drink without vomiting. ?Make sure you have little or no nausea before eating solid foods. ?General instructions ?Have a responsible adult stay with you for the time you are told. It is important to have someone help care for you until you are awake and alert. ?Take over-the-counter and prescription medicines only as told by your health care provider. ?If you have sleep apnea, surgery and certain medicines can increase your risk for breathing problems. Follow instructions from your health care provider about wearing your sleep device: ?Anytime you are sleeping, including during daytime naps. ?While taking prescription pain medicines, sleeping medicines, or medicines that make you drowsy. ?Avoid smoking. ?Keep all follow-up visits as told by your health care provider. This is important. ?Contact a health care provider if: ?You keep feeling nauseous or you keep vomiting. ?You feel light-headed. ?You are still sleepy or having trouble with balance after 24 hours. ?You develop a rash. ?You have a fever. ?You have redness or swelling around the IV site. ?  Get help right away if: ?You have trouble breathing. ?You have new-onset confusion at home. ?Summary ?For several hours after your procedure, you may feel tired. You may also be forgetful and have poor judgment. ?Have a responsible adult stay with you for the time you are told. It is important to have someone help care for you until you are awake and alert. ?Rest as told. Do not drive or operate machinery. Do not drink alcohol or take sleeping pills. ?Get help right away if you have trouble breathing, or if you suddenly become confused. ?This information is not intended to replace advice given to you by your health care provider. Make sure you discuss any questions you have with your health care provider. ?Document Revised: 01/17/2020 Document Reviewed: 04/05/2019 ?Elsevier Patient Education ? [place]. ? ?

## 2021-07-30 ENCOUNTER — Encounter (HOSPITAL_COMMUNITY): Payer: Self-pay

## 2021-07-30 ENCOUNTER — Encounter (HOSPITAL_COMMUNITY)
Admission: RE | Admit: 2021-07-30 | Discharge: 2021-07-30 | Disposition: A | Discharge status: Home / Self Care | Payer: BC Managed Care – PPO | Source: Ambulatory Visit | Attending: Internal Medicine | Admitting: Internal Medicine

## 2021-08-01 ENCOUNTER — Ambulatory Visit (HOSPITAL_COMMUNITY)
Admission: RE | Admit: 2021-08-01 | Discharge: 2021-08-01 | Disposition: A | Discharge status: Home / Self Care | Payer: BC Managed Care – PPO | Source: Ambulatory Visit | Attending: Urology | Admitting: Urology

## 2021-08-01 ENCOUNTER — Other Ambulatory Visit: Payer: Self-pay

## 2021-08-01 DIAGNOSIS — N402 Nodular prostate without lower urinary tract symptoms: Secondary | ICD-10-CM | POA: Diagnosis not present

## 2021-08-01 DIAGNOSIS — R590 Localized enlarged lymph nodes: Secondary | ICD-10-CM | POA: Diagnosis not present

## 2021-08-01 DIAGNOSIS — R972 Elevated prostate specific antigen [PSA]: Secondary | ICD-10-CM | POA: Diagnosis not present

## 2021-08-01 MED ORDER — GADOBUTROL 1 MMOL/ML IV SOLN
8.0000 mL | Freq: Once | INTRAVENOUS | Status: AC | PRN
  Administered 2021-08-01: 8 mL via INTRAVENOUS

## 2021-08-03 ENCOUNTER — Ambulatory Visit (HOSPITAL_COMMUNITY): Payer: BC Managed Care – PPO | Admitting: Anesthesiology

## 2021-08-03 ENCOUNTER — Ambulatory Visit (HOSPITAL_COMMUNITY)
Admission: RE | Admit: 2021-08-03 | Discharge: 2021-08-03 | Disposition: A | Discharge status: Home / Self Care | Payer: BC Managed Care – PPO | Attending: Internal Medicine | Admitting: Internal Medicine

## 2021-08-03 ENCOUNTER — Encounter (HOSPITAL_COMMUNITY): Payer: Self-pay

## 2021-08-03 ENCOUNTER — Encounter (HOSPITAL_COMMUNITY)
Admission: RE | Disposition: A | Discharge status: Home / Self Care | Payer: Self-pay | Source: Home / Self Care | Attending: Internal Medicine

## 2021-08-03 DIAGNOSIS — K648 Other hemorrhoids: Secondary | ICD-10-CM | POA: Insufficient documentation

## 2021-08-03 DIAGNOSIS — E119 Type 2 diabetes mellitus without complications: Secondary | ICD-10-CM | POA: Insufficient documentation

## 2021-08-03 DIAGNOSIS — Z7984 Long term (current) use of oral hypoglycemic drugs: Secondary | ICD-10-CM | POA: Diagnosis not present

## 2021-08-03 DIAGNOSIS — D122 Benign neoplasm of ascending colon: Secondary | ICD-10-CM | POA: Insufficient documentation

## 2021-08-03 DIAGNOSIS — Z1211 Encounter for screening for malignant neoplasm of colon: Secondary | ICD-10-CM | POA: Insufficient documentation

## 2021-08-03 DIAGNOSIS — K6389 Other specified diseases of intestine: Secondary | ICD-10-CM

## 2021-08-03 DIAGNOSIS — I10 Essential (primary) hypertension: Secondary | ICD-10-CM | POA: Insufficient documentation

## 2021-08-03 DIAGNOSIS — K219 Gastro-esophageal reflux disease without esophagitis: Secondary | ICD-10-CM | POA: Diagnosis not present

## 2021-08-03 DIAGNOSIS — K635 Polyp of colon: Secondary | ICD-10-CM | POA: Diagnosis not present

## 2021-08-03 HISTORY — PX: BIOPSY: SHX5522

## 2021-08-03 HISTORY — PX: COLONOSCOPY WITH PROPOFOL: SHX5780

## 2021-08-03 HISTORY — PX: POLYPECTOMY: SHX5525

## 2021-08-03 LAB — GLUCOSE, CAPILLARY: Glucose-Capillary: 125 mg/dL — ABNORMAL HIGH (ref 70–99)

## 2021-08-03 SURGERY — COLONOSCOPY WITH PROPOFOL
Anesthesia: General

## 2021-08-03 MED ORDER — PROPOFOL 500 MG/50ML IV EMUL
INTRAVENOUS | Status: DC | PRN
Start: 2021-08-03 — End: 2021-08-03
  Administered 2021-08-03: 125 ug/kg/min via INTRAVENOUS

## 2021-08-03 MED ORDER — PROPOFOL 10 MG/ML IV BOLUS
INTRAVENOUS | Status: DC | PRN
  Administered 2021-08-03: 100 mg via INTRAVENOUS

## 2021-08-03 MED ORDER — LACTATED RINGERS IV SOLN: INTRAVENOUS | Status: DC

## 2021-08-03 MED ORDER — LIDOCAINE HCL (CARDIAC) PF 100 MG/5ML IV SOSY
PREFILLED_SYRINGE | INTRAVENOUS | Status: DC | PRN
  Administered 2021-08-03: 50 mg via INTRAVENOUS

## 2021-08-03 NOTE — Transfer of Care (Signed)
Immediate Anesthesia Transfer of Care Note ? ?Patient: Tristan Mccarty ? ?Procedure(s) Performed: COLONOSCOPY WITH PROPOFOL ?BIOPSY ?POLYPECTOMY ? ?Patient Location: Short Stay ? ?Anesthesia Type:General ? ?Level of Consciousness: awake and alert  ? ?Airway & Oxygen Therapy: Patient Spontanous Breathing ? ?Post-op Assessment: Report given to RN and Post -op Vital signs reviewed and stable ? ?Post vital signs: Reviewed and stable ? ?Last Vitals:  ?Vitals Value Taken Time  ?BP 115/62 08/03/21 0929  ?Temp 37 ?C 08/03/21 0929  ?Pulse 87 08/03/21 0929  ?Resp 17 08/03/21 0929  ?SpO2 97 % 08/03/21 0929  ? ? ?Last Pain:  ?Vitals:  ? 08/03/21 0929  ?TempSrc: Oral  ?PainSc: 0-No pain  ?   ? ?Patients Stated Pain Goal: 5 (08/03/21 4720) ? ?Complications: No notable events documented. ?

## 2021-08-03 NOTE — Interval H&P Note (Signed)
History and Physical Interval Note: ? ?08/03/2021 ?9:04 AM ? ?[name]  has presented today for surgery, with the diagnosis of screening.  The various methods of treatment have been discussed with the patient and family. After consideration of risks, benefits and other options for treatment, the patient has consented to  Procedure(s) with comments: ?COLONOSCOPY WITH PROPOFOL (N/A) - 9:15am, ASA 2 as a surgical intervention.  The patient's history has been reviewed, patient examined, no change in status, stable for surgery.  I have reviewed the patient's chart and labs.  Questions were answered to the patient's satisfaction.   ? ? ?Tristan Mccarty ? ? ?

## 2021-08-03 NOTE — Op Note (Signed)
[facility] ?Patient Name: Tristan Mccarty ?Procedure Date: 08/03/2021 9:01 AM ?MRN: [id number] ?Date of Birth: 11-06-1955 ?Attending MD: Elon Alas. Abbey Chatters , DO ?CSN: [id number] ?Age: 66 ?Admit Type: Outpatient ?Procedure:                Colonoscopy ?Indications:              Screening for colorectal malignant neoplasm ?Providers:                Elon Alas. Abbey Chatters, DO, Janeece Riggers, RN, Crisann  ?                          Wynonia Lawman, Technician ?Referring MD:              ?Medicines:                See the Anesthesia note for documentation of the  ?                          administered medications ?Complications:            No immediate complications. ?Estimated Blood Loss:     Estimated blood loss was minimal. ?Procedure:                Pre-Anesthesia Assessment: ?                          - The anesthesia plan was to use monitored  ?                          anesthesia care (MAC). ?                          After obtaining informed consent, the colonoscope  ?                          was passed under direct vision. Throughout the  ?                          procedure, the patient's blood pressure, pulse, and  ?                          oxygen saturations were monitored continuously. The  ?                          PCF-HQ190L ([id number]) scope was introduced through  ?                          the anus and advanced to the the cecum, identified  ?                          by appendiceal orifice and ileocecal valve. The  ?                          colonoscopy was performed without difficulty. The  ?                          patient tolerated the procedure well. The  quality  ?                          of the bowel preparation was evaluated using the  ?                          BBPS [facility] Bowel Preparation Scale) with scores  ?                          of: Right Colon = 2 (minor amount of residual  ?                          staining, small fragments of stool and/or opaque  ?                          liquid, but mucosa  seen well), Transverse Colon = 3  ?                          (entire mucosa seen well with no residual staining,  ?                          small fragments of stool or opaque liquid) and Left  ?                          Colon = 3 (entire mucosa seen well with no residual  ?                          staining, small fragments of stool or opaque  ?                          liquid). The total BBPS score equals 8. The quality  ?                          of the bowel preparation was good. ?Scope In: 9:11:47 AM ?Scope Out: 9:26:28 AM ?Scope Withdrawal Time: 0 hours 12 minutes 37 seconds  ?Total Procedure Duration: 0 hours 14 minutes 41 seconds  ?Findings: ?     The perianal and digital rectal examinations were normal. ?     Non-bleeding internal hemorrhoids were found during endoscopy. ?     A 4 mm polyp was found in the ascending colon. The polyp was sessile.  ?     The polyp was removed with a cold snare. Resection and retrieval were  ?     complete. ?     An area of nodular mucosa was found at the ileocecal valve. Biopsies  ?     were taken with a cold forceps for histology. ?     The exam was otherwise without abnormality. ?Impression:               - Non-bleeding internal hemorrhoids. ?                          - One 4 mm polyp in the ascending colon, removed  ?  with a cold snare. Resected and retrieved. ?                          - Nodular mucosa at the ileocecal valve. Biopsied. ?                          - The examination was otherwise normal. ?Moderate Sedation: ?     Per Anesthesia Care ?Recommendation:           - Patient has a contact number available for  ?                          emergencies. The signs and symptoms of potential  ?                          delayed complications were discussed with the  ?                          patient. Return to normal activities tomorrow.  ?                          Written discharge instructions were provided to the  ?                           patient. ?                          - Resume previous diet. ?                          - Continue present medications. ?                          - Await pathology results. ?                          - Repeat colonoscopy in 5 years for surveillance. ?                          - Return to GI clinic PRN. ?Procedure Code(s):        --- Professional --- ?                          [id number], Colonoscopy, flexible; with removal of  ?                          tumor(s), polyp(s), or other lesion(s) by snare  ?                          technique ?                          45380, 59, Colonoscopy, flexible; with biopsy,  ?                          single or multiple ?Diagnosis Code(s):        --- Professional --- ?  Z12.11, Encounter for screening for malignant  ?                          neoplasm of colon ?                          K63.5, Polyp of colon ?                          K63.89, Other specified diseases of intestine ?                          K64.8, Other hemorrhoids ?CPT copyright 2019 American Medical Association. All rights reserved. ?The codes documented in this report are preliminary and upon coder review may  ?be revised to meet current compliance requirements. ?Elon Alas. Abbey Chatters, DO ?Elon Alas. [place], DO ?08/03/2021 9:30:02 AM ?This report has been signed electronically. ?Number of Addenda: 0 ?

## 2021-08-03 NOTE — Discharge Instructions (Signed)
?  Colonoscopy ?Discharge Instructions ? ?Read the instructions outlined below and refer to this sheet in the next few weeks. These discharge instructions provide you with general information on caring for yourself after you leave the hospital. Your doctor may also give you specific instructions. While your treatment has been planned according to the most current medical practices available, unavoidable complications occasionally occur.  ? ?ACTIVITY ?You may resume your regular activity, but move at a slower pace for the next 24 hours.  ?Take frequent rest periods for the next 24 hours.  ?Walking will help get rid of the air and reduce the bloated feeling in your belly (abdomen).  ?No driving for 24 hours (because of the medicine (anesthesia) used during the test).   ?Do not sign any important legal documents or operate any machinery for 24 hours (because of the anesthesia used during the test).  ?NUTRITION ?Drink plenty of fluids.  ?You may resume your normal diet as instructed by your doctor.  ?Begin with a light meal and progress to your normal diet. Heavy or fried foods are harder to digest and may make you feel sick to your stomach (nauseated).  ?Avoid alcoholic beverages for 24 hours or as instructed.  ?MEDICATIONS ?You may resume your normal medications unless your doctor tells you otherwise.  ?WHAT YOU CAN EXPECT TODAY ?Some feelings of bloating in the abdomen.  ?Passage of more gas than usual.  ?Spotting of blood in your stool or on the toilet paper.  ?IF YOU HAD POLYPS REMOVED DURING THE COLONOSCOPY: ?No aspirin products for 7 days or as instructed.  ?No alcohol for 7 days or as instructed.  ?Eat a soft diet for the next 24 hours.  ?FINDING OUT THE RESULTS OF YOUR TEST ?Not all test results are available during your visit. If your test results are not back during the visit, make an appointment with your caregiver to find out the results. Do not assume everything is normal if you have not heard from your  caregiver or the medical facility. It is important for you to follow up on all of your test results.  ?SEEK IMMEDIATE MEDICAL ATTENTION IF: ?You have more than a spotting of blood in your stool.  ?Your belly is swollen (abdominal distention).  ?You are nauseated or vomiting.  ?You have a temperature over 101.  ?You have abdominal pain or discomfort that is severe or gets worse throughout the day.  ? ?Your colonoscopy revealed 1 polyp(s) which I removed successfully. Await pathology results, my office will contact you. I recommend repeating colonoscopy in 5 years for surveillance purposes. I did take a few biopsies of the valve that connects your small bowel to your colon as it appeared slightly abnormal. We will call you with these results. Otherwise follow up with GI as needed.  ? ? ?I hope you have a great rest of your week! ? ?Elon Alas. Abbey Chatters, D.O. ?Gastroenterology and Hepatology ?[facility] ? ?

## 2021-08-03 NOTE — Anesthesia Postprocedure Evaluation (Signed)
Anesthesia Post Note ? ?Patient: LAVONTE PALOS ? ?Procedure(s) Performed: COLONOSCOPY WITH PROPOFOL ?BIOPSY ?POLYPECTOMY ? ?Patient location during evaluation: Phase II ?Anesthesia Type: General ?Level of consciousness: awake and alert and oriented ?Pain management: pain level controlled ?Vital Signs Assessment: post-procedure vital signs reviewed and stable ?Respiratory status: spontaneous breathing, nonlabored ventilation and respiratory function stable ?Cardiovascular status: blood pressure returned to baseline and stable ?Postop Assessment: no apparent nausea or vomiting ?Anesthetic complications: no ? ? ?No notable events documented. ? ? ?Last Vitals:  ?Vitals:  ? 08/03/21 0823 08/03/21 0929  ?BP: (!) 160/83 115/62  ?Pulse: 76 87  ?Resp: 18 17  ?Temp:  37 ?C  ?SpO2: 96% 97%  ?  ?Last Pain:  ?Vitals:  ? 08/03/21 0929  ?TempSrc: Oral  ?PainSc: 0-No pain  ? ? ?  ?  ?  ?  ?  ?  ? ?Mitra Duling C Cashay Manganelli ? ? ? ? ?

## 2021-08-03 NOTE — Anesthesia Preprocedure Evaluation (Signed)
Anesthesia Evaluation  ?Patient identified by MRN, date of birth, ID band ?Patient awake ? ? ? ?Reviewed: ?Allergy & Precautions, NPO status , Patient's Chart, lab work & pertinent test results ? ?Airway ?Mallampati: II ? ?TM Distance: >3 FB ? ? ? ? Dental ? ?(+) Dental Advisory Given, Teeth Intact ?  ?Pulmonary ?neg pulmonary ROS,  ?  ?Pulmonary exam normal ?breath sounds clear to auscultation ? ? ? ? ? ? Cardiovascular ?hypertension, Pt. on medications ?Normal cardiovascular exam ?Rhythm:Regular Rate:Normal ? ? ?  ?Neuro/Psych ?negative neurological ROS ? negative psych ROS  ? GI/Hepatic ?Neg liver ROS, GERD  Medicated,  ?Endo/Other  ?diabetes, Well Controlled, Type 2, Oral Hypoglycemic Agents ? Renal/GU ?negative Renal ROS  ?negative genitourinary ?  ?Musculoskeletal ?negative musculoskeletal ROS ?(+)  ? Abdominal ?  ?Peds ?negative pediatric ROS ?(+)  Hematology ?negative hematology ROS ?(+)   ?Anesthesia Other Findings ? ? Reproductive/Obstetrics ?negative OB ROS ? ?  ? ? ? ? ? ? ? ? ? ? ? ? ? ?  ?  ? ? ? ? ? ? ? ? ?Anesthesia Physical ?Anesthesia Plan ? ?ASA: 2 ? ?Anesthesia Plan: General  ? ?Post-op Pain Management: Minimal or no pain anticipated  ? ?Induction: Intravenous ? ?PONV Risk Score and Plan: TIVA ? ?Airway Management Planned: Nasal Cannula and Natural Airway ? ?Additional Equipment:  ? ?Intra-op Plan:  ? ?Post-operative Plan:  ? ?Informed Consent: I have reviewed the patients History and Physical, chart, labs and discussed the procedure including the risks, benefits and alternatives for the proposed anesthesia with the patient or authorized representative who has indicated his/her understanding and acceptance.  ? ? ? ?Dental advisory given ? ?Plan Discussed with: Surgeon ? ?Anesthesia Plan Comments:   ? ? ? ? ? ? ?Anesthesia Quick Evaluation ? ?

## 2021-08-04 ENCOUNTER — Other Ambulatory Visit: Payer: Self-pay

## 2021-08-04 LAB — SURGICAL PATHOLOGY

## 2021-08-04 MED ORDER — TRULICITY 0.75 MG/0.5ML ~~LOC~~ SOAJ: 0.7500 mg | SUBCUTANEOUS | 0 refills | Status: DC

## 2021-08-06 ENCOUNTER — Encounter (HOSPITAL_COMMUNITY): Payer: Self-pay | Admitting: Internal Medicine

## 2021-08-07 ENCOUNTER — Other Ambulatory Visit: Payer: Self-pay | Admitting: Family Medicine

## 2021-08-07 DIAGNOSIS — B356 Tinea cruris: Secondary | ICD-10-CM

## 2021-08-31 ENCOUNTER — Ambulatory Visit (INDEPENDENT_AMBULATORY_CARE_PROVIDER_SITE_OTHER): Payer: BC Managed Care – PPO | Admitting: Urology

## 2021-08-31 VITALS — BP 149/79 | HR 88

## 2021-08-31 DIAGNOSIS — N402 Nodular prostate without lower urinary tract symptoms: Secondary | ICD-10-CM

## 2021-08-31 DIAGNOSIS — N529 Male erectile dysfunction, unspecified: Secondary | ICD-10-CM

## 2021-08-31 NOTE — Progress Notes (Signed)
? ?08/31/2021 ?2:44 PM  ? ?Mayme Genta ?[birth year] ?[id number] ? ?Referring provider: Coral Spikes, DO ?[address] ?[address] ?[place],  [place] [address] ? ?No chief complaint on file. ? ? ?HPI: ? ?F/u -  ? ?1) BPH - since 2021. On tamsulosin. No prostate surgery. Some urgency in AM. Noc only 0-1. AUASS = 6.  ?  ?2) prostate nodule - noted 5 mm left apical Mar 2023 on exam. His PSA was 3.5 in Feb 2023 and 2.4 in Aug 2021. An uncle had PCa and treated with the "implants".   ? ?Because of the nodule we set up a prostate MRI March 2023 which was benign (BI-RADS 2) with a 44 g prostate. ? ?  ?2) ED - trouble getting and maintaining an erection. Since 2017. Never tried anything. We discussed pde5i and I sent a Rx but he hasn't tried it.  ? ?  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  ? ?Today, seen for the above.  ? ?PMH: ?Past Medical History:  ?Diagnosis Date  ? Hyperlipidemia   ? Hypertension   ? Reflux   ? Type 2 diabetes mellitus ([place])   ? ? ?Surgical History: ?Past Surgical History:  ?Procedure Laterality Date  ? BIOPSY  08/03/2021  ? Procedure: BIOPSY;  Surgeon: Eloise Harman, DO;  [facility];  Service: Endoscopy;;  ? COLONOSCOPY    ? COLONOSCOPY WITH PROPOFOL N/A 04/03/2021  ? Procedure: COLONOSCOPY WITH PROPOFOL;  Surgeon: Eloise Harman, DO;  [facility];  Service: Endoscopy;  Laterality: N/A;  2:00 / ASA II  ? COLONOSCOPY WITH PROPOFOL N/A 08/03/2021  ? Procedure: COLONOSCOPY WITH PROPOFOL;  Surgeon: Eloise Harman, DO;  [facility];  Service: Endoscopy;  Laterality: N/A;  9:15am, ASA 2  ? EYE SURGERY    ? POLYPECTOMY  04/03/2021  ? Procedure: POLYPECTOMY;  Surgeon: Eloise Harman, DO;  [facility];  Service: Endoscopy;;  ascending  ? POLYPECTOMY  08/03/2021  ? Procedure: POLYPECTOMY;  Surgeon: Eloise Harman, DO;  [facility];  Service: Endoscopy;;  ? ? ?Home Medications:  ?Allergies as of 08/31/2021   ? ?   Reactions  ? Adipex-p  [phentermine Hcl]   ? Upset stomach  ? Vasotec [enalapril]   ? Abdominal pain  ? ?  ? ?  ?Medication List  ?  ? ?  ? Accurate as of August 31, 2021  2:44 PM. If you have any questions, ask your nurse or doctor.  ?  ?  ? ?  ? ?Advil PM 200-25 MG Caps ?Generic drug: Ibuprofen-diphenhydrAMINE HCl ?Take 2 tablets by mouth at bedtime. ?  ?amLODipine 5 MG tablet ?Commonly known as: NORVASC ?Take 1 tablet (5 mg total) by mouth 2 (two) times daily. ?  ?gabapentin 300 MG capsule ?Commonly known as: NEURONTIN ?Take 1-2 capsules (300-600 mg total) by mouth See admin instructions. Take 1 tablet in the morning and 2 tablets at night. ?  ?glipiZIDE 5 MG tablet ?Commonly known as: GLUCOTROL ?TAKE 2 TABLETS (10 MG TOTAL) BY MOUTH 2 (TWO) TIMES DAILY BEFORE A MEAL. ?  ?losartan 100 MG tablet ?Commonly known as: COZAAR ?Take 1 tablet (100 mg total) by mouth daily. ?  ?metFORMIN 1000 MG tablet ?Commonly known as: GLUCOPHAGE ?TAKE 1 TABLET BY MOUTH TWICE A DAY ?  ?pantoprazole 40 MG tablet ?Commonly known as: PROTONIX ?Take 1 tablet (40 mg total) by mouth daily. ?What changed:  ?when to  take this ?reasons to take this ?  ?sildenafil 20 MG tablet ?Commonly known as: REVATIO ?Take 1 tablet (20 mg total) by mouth daily. Take 1-5 tablets as needed ?What changed:  ?how much to take ?when to take this ?  ?simvastatin 40 MG tablet ?Commonly known as: ZOCOR ?TAKE 1 TABLET BY MOUTH EVERY DAY ?  ?tamsulosin 0.4 MG Caps capsule ?Commonly known as: FLOMAX ?TAKE 1 CAPSULE BY MOUTH EVERY DAY ?  ?Trulance 3 MG Tabs ?Generic drug: Plecanatide ?Take 3 mg by mouth daily. ?  ?Trulicity 0.62 BJ/6.2GB Sopn ?Generic drug: Dulaglutide ?Inject 0.75 mg into the skin every Sunday. ?  ? ?  ? ? ?Allergies:  ?Allergies  ?Allergen Reactions  ? Adipex-P [Phentermine Hcl]   ?  Upset stomach  ? Vasotec [Enalapril]   ?  Abdominal pain  ? ? ?Family History: ?Family History  ?Problem Relation Age of Onset  ? Cancer Mother   ?     Breast  ? Hypertension Mother   ? Heart  attack Father   ? Colon cancer Neg Hx   ? ? ?Social History:  reports that he has never smoked. He has never used smokeless tobacco. He reports that he does not currently use alcohol. He reports that he does not currently use drugs. ? ? ?Physical Exam: ?There were no vitals taken for this visit.  ?Constitutional:  Alert and oriented, No acute distress. ?HEENT: [place] AT, moist mucus membranes.  Trachea midline, no masses. ?Cardiovascular: No clubbing, cyanosis, or edema. ?Respiratory: Normal respiratory effort, no increased work of breathing. ?GI: Abdomen is soft, nontender, nondistended, no abdominal masses ?GU: No CVA tenderness ?Lymph: No cervical or inguinal lymphadenopathy. ?Skin: No rashes, bruises or suspicious lesions. ?Neurologic: Grossly intact, no focal deficits, moving all 4 extremities. ?Psychiatric: Normal mood and affect. ? ?Laboratory Data: ?Lab Results  ?Component Value Date  ? WBC 7.1 07/06/2021  ? HGB 15.5 07/06/2021  ? HCT 44.6 07/06/2021  ? MCV 90 07/06/2021  ? PLT 157 07/06/2021  ? ? ?Lab Results  ?Component Value Date  ? CREATININE 0.64 (L) 07/06/2021  ? ? ?Lab Results  ?Component Value Date  ? PSA 1.54 02/11/2014  ? ? ?No results found for: TESTOSTERONE ? ?Lab Results  ?Component Value Date  ? HGBA1C 7.8 (H) 07/06/2021  ? ? ?Urinalysis ?No results found for: COLORURINE, APPEARANCEUR, [place], [place], [place], [place], [place], KETONESUR, PROTEINUR, [place], NITRITE, LEUKOCYTESUR ? ?Lab Results  ?Component Value Date  ? LABMICR <3.0 10/14/2020  ? ? ?Pertinent Imaging: ?Reviewed images  ? ?Assessment & Plan:   ? ?1. Prostate nodule - PSA low and MRI benign. Check in 6 mo  ? ?- Urinalysis, Routine w reflex microscopic ? ?2. ED- disc again nature r/b/a to pde5i. He will start.  ? ?No follow-ups on file. ? ?Festus Aloe, MD ? ?[facility]  ?[place], [place] [address] ?[phone] ? ? ?

## 2021-09-01 LAB — URINALYSIS, ROUTINE W REFLEX MICROSCOPIC
Bilirubin, UA: NEGATIVE
Leukocytes,UA: NEGATIVE
Nitrite, UA: NEGATIVE
Protein,UA: NEGATIVE
RBC, UA: NEGATIVE
Specific Gravity, UA: 1.025 (ref 1.005–1.030)
Urobilinogen, Ur: 0.2 mg/dL (ref 0.2–1.0)
pH, UA: 5.5 (ref 5.0–7.5)

## 2021-09-08 ENCOUNTER — Other Ambulatory Visit: Payer: Self-pay | Admitting: *Deleted

## 2021-09-08 MED ORDER — TRULICITY 0.75 MG/0.5ML ~~LOC~~ SOAJ: 0.7500 mg | SUBCUTANEOUS | 0 refills | Status: DC

## 2021-10-08 ENCOUNTER — Other Ambulatory Visit: Payer: Self-pay

## 2021-10-08 DIAGNOSIS — I10 Essential (primary) hypertension: Secondary | ICD-10-CM

## 2021-10-08 MED ORDER — AMLODIPINE BESYLATE 5 MG PO TABS: 5.0000 mg | ORAL_TABLET | Freq: Two times a day (BID) | ORAL | 0 refills | Status: DC

## 2021-10-08 MED ORDER — LOSARTAN POTASSIUM 100 MG PO TABS: 100.0000 mg | ORAL_TABLET | Freq: Every day | ORAL | 0 refills | Status: DC

## 2021-10-09 ENCOUNTER — Other Ambulatory Visit: Payer: Self-pay

## 2021-10-09 DIAGNOSIS — M792 Neuralgia and neuritis, unspecified: Secondary | ICD-10-CM

## 2021-10-09 DIAGNOSIS — E119 Type 2 diabetes mellitus without complications: Secondary | ICD-10-CM

## 2021-10-09 DIAGNOSIS — E782 Mixed hyperlipidemia: Secondary | ICD-10-CM

## 2021-10-09 MED ORDER — GLIPIZIDE 5 MG PO TABS: 10.0000 mg | ORAL_TABLET | Freq: Two times a day (BID) | ORAL | 1 refills | Status: DC

## 2021-10-09 MED ORDER — SIMVASTATIN 40 MG PO TABS: 40.0000 mg | ORAL_TABLET | Freq: Every day | ORAL | 1 refills | Status: DC

## 2021-10-09 MED ORDER — METFORMIN HCL 1000 MG PO TABS: 1000.0000 mg | ORAL_TABLET | Freq: Two times a day (BID) | ORAL | 1 refills | Status: DC

## 2021-10-09 MED ORDER — GABAPENTIN 300 MG PO CAPS: 300.0000 mg | ORAL_CAPSULE | ORAL | 1 refills | Status: DC

## 2021-10-09 MED ORDER — TRULICITY 0.75 MG/0.5ML ~~LOC~~ SOAJ: 0.7500 mg | SUBCUTANEOUS | 1 refills | Status: DC

## 2021-10-09 MED ORDER — ONETOUCH ULTRA VI STRP: ORAL_STRIP | 5 refills | Status: DC

## 2021-10-09 MED ORDER — TAMSULOSIN HCL 0.4 MG PO CAPS: 0.4000 mg | ORAL_CAPSULE | Freq: Every day | ORAL | 1 refills | Status: DC

## 2021-11-03 ENCOUNTER — Ambulatory Visit (INDEPENDENT_AMBULATORY_CARE_PROVIDER_SITE_OTHER): Payer: BC Managed Care – PPO | Admitting: Family Medicine

## 2021-11-03 VITALS — BP 124/65 | HR 87 | Temp 98.1°F | Ht 68.0 in | Wt 188.0 lb

## 2021-11-03 DIAGNOSIS — E782 Mixed hyperlipidemia: Secondary | ICD-10-CM

## 2021-11-03 DIAGNOSIS — I10 Essential (primary) hypertension: Secondary | ICD-10-CM

## 2021-11-03 DIAGNOSIS — E119 Type 2 diabetes mellitus without complications: Secondary | ICD-10-CM

## 2021-11-03 NOTE — Assessment & Plan Note (Signed)
At goal.  Well-controlled.  Continue losartan and amlodipine.

## 2021-11-03 NOTE — Patient Instructions (Signed)
Continue your medications.  We will call with A1C results.  Follow up in 6 months (pending A1C)   Take care  Dr. Lacinda Axon

## 2021-11-03 NOTE — Assessment & Plan Note (Signed)
Continue current meds. Will increase trulicity if C2E is not at goal.

## 2021-11-03 NOTE — Assessment & Plan Note (Signed)
Stable. Continue Simvastatin.

## 2021-11-03 NOTE — Progress Notes (Signed)
Subjective:  Patient ID: Tristan Mccarty, male    DOB: 05/02/1956  Age: 66 y.o. MRN: [id number]  CC: Chief Complaint  Patient presents with   Diabetes   Hypertension    HPI:  66 year old male with hypertension, GERD, type 2 diabetes, BPH, constipation, hyperlipidemia presents for follow-up.  Blood pressure well controlled.  Patient is on amlodipine and losartan and doing well at this time.  Patient needs repeat A1c to reassess diabetes.  He endorses compliance with metformin, glipizide, and Trulicity.    Hyperlipidemia has been stable.  Last LDL was 83.  Patient states that overall he is feeling well.  Denies chest pain or shortness of breath.  No significant constipation.  Patient Active Problem List   Diagnosis Date Noted   Constipation 07/20/2021   GERD (gastroesophageal reflux disease) 07/06/2021   BPH (benign prostatic hyperplasia) 07/06/2021   Hyperlipidemia 05/13/2013   Essential hypertension, benign 05/13/2013   Type 2 diabetes mellitus without complications ([place]) 53/61/4431    Social Hx   Social History   Socioeconomic History   Marital status: Married    Spouse name: Not on file   Number of children: Not on file   Years of education: Not on file   Highest education level: Not on file  Occupational History   Not on file  Tobacco Use   Smoking status: Never   Smokeless tobacco: Never  Vaping Use   Vaping Use: Never used  Substance and Sexual Activity   Alcohol use: Not Currently   Drug use: Not Currently   Sexual activity: Yes  Other Topics Concern   Not on file  Social History Narrative   Not on file   Social Determinants of Health   Financial Resource Strain: Not on file  Food Insecurity: Not on file  Transportation Needs: Not on file  Physical Activity: Not on file  Stress: Not on file  Social Connections: Not on file    Review of Systems Per HPI  Objective:  BP 124/65   Pulse 87   Temp 98.1 F (36.7 C)   Ht '5\' 8"'$  (1.727 m)   Wt  188 lb (85.3 kg)   SpO2 97%   BMI 28.59 kg/m      11/03/2021    3:08 PM 11/03/2021    2:50 PM 08/31/2021    3:06 PM  BP/Weight  Systolic BP 540 086 761  Diastolic BP 65 72 79  Wt. (Lbs)  188   BMI  28.59 kg/m2     Physical Exam Constitutional:      General: He is not in acute distress.    Appearance: Normal appearance.  HENT:     Head: Normocephalic and atraumatic.  Eyes:     General:        Right eye: No discharge.        Left eye: No discharge.     Conjunctiva/sclera: Conjunctivae normal.  Cardiovascular:     Rate and Rhythm: Normal rate and regular rhythm.  Pulmonary:     Effort: Pulmonary effort is normal.     Breath sounds: Normal breath sounds. No wheezing, rhonchi or rales.  Abdominal:     General: There is no distension.     Palpations: Abdomen is soft.     Tenderness: There is no abdominal tenderness.  Neurological:     Mental Status: He is alert.  Psychiatric:        Mood and Affect: Mood normal.        Behavior:  Behavior normal.     Lab Results  Component Value Date   WBC 7.1 07/06/2021   HGB 15.5 07/06/2021   HCT 44.6 07/06/2021   PLT 157 07/06/2021   GLUCOSE 131 (H) 07/06/2021   CHOL 161 07/06/2021   TRIG 171 (H) 07/06/2021   HDL 49 07/06/2021   LDLCALC 83 07/06/2021   ALT 12 07/06/2021   AST 13 07/06/2021   NA 139 07/06/2021   K 4.0 07/06/2021   CL 101 07/06/2021   CREATININE 0.64 (L) 07/06/2021   BUN 14 07/06/2021   CO2 20 07/06/2021   PSA 1.54 02/11/2014   HGBA1C 7.8 (H) 07/06/2021   MICROALBUR 0.5 06/29/2014     Assessment & Plan:   Problem List Items Addressed This Visit       Cardiovascular and Mediastinum   Essential hypertension, benign    At goal.  Well-controlled.  Continue losartan and amlodipine.        Endocrine   Type 2 diabetes mellitus without complications ([place]) - Primary    Continue current meds. Will increase trulicity if Q5Z is not at goal.       Relevant Orders   Hemoglobin A1c     Other    Hyperlipidemia    Stable. Continue Simvastatin.        Follow-up:  Return in about 6 months (around 05/05/2022).  [place]

## 2021-11-04 ENCOUNTER — Other Ambulatory Visit: Payer: Self-pay | Admitting: Family Medicine

## 2021-11-04 DIAGNOSIS — E119 Type 2 diabetes mellitus without complications: Secondary | ICD-10-CM

## 2021-11-04 LAB — HEMOGLOBIN A1C
Est. average glucose Bld gHb Est-mCnc: 169 mg/dL
Hgb A1c MFr Bld: 7.5 % — ABNORMAL HIGH (ref 4.8–5.6)

## 2021-11-04 MED ORDER — TRULICITY 1.5 MG/0.5ML ~~LOC~~ SOAJ: 1.5000 mg | SUBCUTANEOUS | 0 refills | Status: DC

## 2021-11-06 ENCOUNTER — Other Ambulatory Visit: Payer: Self-pay | Admitting: Family Medicine

## 2021-11-06 DIAGNOSIS — I10 Essential (primary) hypertension: Secondary | ICD-10-CM

## 2021-11-07 ENCOUNTER — Other Ambulatory Visit: Payer: Self-pay | Admitting: Family Medicine

## 2021-11-07 DIAGNOSIS — I10 Essential (primary) hypertension: Secondary | ICD-10-CM

## 2021-11-18 ENCOUNTER — Other Ambulatory Visit: Payer: Self-pay

## 2021-11-18 MED ORDER — TRULICITY 1.5 MG/0.5ML ~~LOC~~ SOAJ: 1.5000 mg | SUBCUTANEOUS | 0 refills | Status: DC

## 2021-11-27 ENCOUNTER — Ambulatory Visit (INDEPENDENT_AMBULATORY_CARE_PROVIDER_SITE_OTHER): Payer: BC Managed Care – PPO | Admitting: Nurse Practitioner

## 2021-11-27 VITALS — BP 121/72 | HR 89 | Temp 98.2°F | Ht 68.0 in | Wt 184.0 lb

## 2021-11-27 DIAGNOSIS — I951 Orthostatic hypotension: Secondary | ICD-10-CM | POA: Diagnosis not present

## 2021-11-27 DIAGNOSIS — E119 Type 2 diabetes mellitus without complications: Secondary | ICD-10-CM | POA: Diagnosis not present

## 2021-11-27 DIAGNOSIS — I10 Essential (primary) hypertension: Secondary | ICD-10-CM

## 2021-11-27 NOTE — Progress Notes (Unsigned)
   Subjective:    Patient ID: Tristan Mccarty, male    DOB: [birth year], 66 y.o.   MRN: [id number]  HPI Presents for complaints of an episode of extreme dizziness that occurred yesterday.  Patient has had some mild dizziness in the past but states this was "different".  BP has been running 140s over 90s.  Yesterday 137/73.  States he was crawling around on the floor in his home and stood up suddenly when the symptoms began.  Has not had any further episodes since.  States the room was air-conditioned.  Tries to stay hydrated by drinking a large amount of water.  His amlodipine 5 mg was increased to twice daily dosing on 11/06/2021. Has been under increased stress lately, his wife passed away 2 weeks ago.  Review of Systems  Respiratory:  Negative for chest tightness and shortness of breath.   Cardiovascular:  Negative for chest pain, palpitations and leg swelling.  Neurological:  Positive for dizziness and light-headedness. Negative for syncope, facial asymmetry, speech difficulty, weakness, numbness and headaches.       Objective:   Physical Exam NAD.  Alert, oriented.  Calm cheerful affect.  Speech clear.  No facial asymmetry.  Lungs clear.  Heart regular rate rhythm.  No murmur noted.  Carotids no bruits or thrills.  Lower extremities no edema.  Gait steady.  See orthostatic vital signs.  BP from supine 133/76 to standing 105/69.  Today's Vitals   11/27/21 1459  BP: 121/72  Pulse: 89  Temp: 98.2 F (36.8 C)  SpO2: 98%  Weight: 184 lb (83.5 kg)  Height: '5\' 8"'$  (1.727 m)   Body mass index is 27.98 kg/m.        Assessment & Plan:   Problem List Items Addressed This Visit       Cardiovascular and Mediastinum   Essential hypertension, benign   Relevant Orders   Basic metabolic panel (Completed)     Endocrine   Type 2 diabetes mellitus without complications ([place])   Relevant Orders   Basic metabolic panel (Completed)   Other Visit Diagnoses     Orthostatic hypotension     -  Primary   Relevant Orders   Basic metabolic panel (Completed)      BMP ordered to check electrolytes and hydration. Reduce amlodipine 5 mg to once daily, perform home BP monitoring record results and send to the office next week. Ensure adequate hydration especially if exposed to heat. Move slowly from lying or sitting positions. Given written and verbal information on orthostatic hypotension. Warning signs reviewed.  Patient to call the office or go to ED/urgent care if worse or new symptoms.

## 2021-11-27 NOTE — Patient Instructions (Signed)
Take Amlodipine once a day Orthostatic Hypotension Blood pressure is a measurement of how strongly, or weakly, your circulating blood is pressing against the walls of your arteries. Orthostatic hypotension is a drop in blood pressure that can happen when you change positions, such as when you go from lying down to standing. Arteries are blood vessels that carry blood from your heart throughout your body. When blood pressure is too low, you may not get enough blood to your brain or to the rest of your organs. Orthostatic hypotension can cause light-headedness, sweating, rapid heartbeat, blurred vision, and fainting. These symptoms require further investigation into the cause. What are the causes? Orthostatic hypotension can be caused by many things, including: Sudden changes in posture, such as standing up quickly after you have been sitting or lying down. Loss of blood (anemia) or loss of body fluids (dehydration). Heart problems, neurologic problems, or hormone problems. Pregnancy. Aging. The risk for this condition increases as you get older. Severe infection (sepsis). Certain medicines, such as medicines for high blood pressure or medicines that make the body lose excess fluids (diuretics). What are the signs or symptoms? Symptoms of this condition may include: Weakness, light-headedness, or dizziness. Sweating. Blurred vision. Tiredness (fatigue). Rapid heartbeat. Fainting, in severe cases. How is this diagnosed? This condition is diagnosed based on: Your symptoms and medical history. Your blood pressure measurements. Your health care provider will check your blood pressure when you are: Lying down. Sitting. Standing. A blood pressure reading is recorded as two numbers, such as "120 over 80" (or 120/80). The first ("top") number is called the systolic pressure. It is a measure of the pressure in your arteries as your heart beats. The second ("bottom") number is called the diastolic  pressure. It is a measure of the pressure in your arteries when your heart relaxes between beats. Blood pressure is measured in a unit called mmHg. Healthy blood pressure for most adults is 120/80 mmHg. Orthostatic hypotension is defined as a 20 mmHg drop in systolic pressure or a 10 mmHg drop in diastolic pressure within 3 minutes of standing. Other information or tests that may be used to diagnose orthostatic hypotension include: Your other vital signs, such as your heart rate and temperature. Blood tests. An electrocardiogram (ECG) or echocardiogram. A Holter monitor. This is a device you wear that records your heart rhythm continuously, usually for 24-48 hours. Tilt table test. For this test, you will be safely secured to a table that moves you from a lying position to an upright position. Your heart rhythm and blood pressure will be monitored during the test. How is this treated? This condition may be treated by: Changing your diet. This may involve eating more salt (sodium) or drinking more water. Changing the dosage of certain medicines you are taking that might be lowering your blood pressure. Correcting the underlying reason for the orthostatic hypotension. Wearing compression stockings. Taking medicines to raise your blood pressure. Avoiding actions that trigger symptoms. Follow these instructions at home: Medicines Take over-the-counter and prescription medicines only as told by your health care provider. Follow instructions from your health care provider about changing the dosage of your current medicines, if this applies. Do not stop or adjust any of your medicines on your own. Eating and drinking  Drink enough fluid to keep your urine pale yellow. Eat extra salt only as directed. Do not add extra salt to your diet unless advised by your health care provider. Eat frequent, small meals. Avoid standing up suddenly  after eating. General instructions  Get up slowly from lying  down or sitting positions. This gives your blood pressure a chance to adjust. Avoid hot showers and excessive heat as directed by your health care provider. Engage in regular physical activity as directed by your health care provider. If you have compression stockings, wear them as told. Keep all follow-up visits. This is important. Contact a health care provider if: You have a fever for more than 2-3 days. You feel more thirsty than usual. You feel dizzy or weak. Get help right away if: You have chest pain. You have a fast or irregular heartbeat. You become sweaty or feel light-headed. You feel short of breath. You faint. You have any symptoms of a stroke. "BE FAST" is an easy way to remember the main warning signs of a stroke: B - Balance. Signs are dizziness, sudden trouble walking, or loss of balance. E - Eyes. Signs are trouble seeing or a sudden change in vision. F - Face. Signs are sudden weakness or numbness of the face, or the face or eyelid drooping on one side. A - Arms. Signs are weakness or numbness in an arm. This happens suddenly and usually on one side of the body. S - Speech. Signs are sudden trouble speaking, slurred speech, or trouble understanding what people say. T - Time. Time to call emergency services. Write down what time symptoms started. You have other signs of a stroke, such as: A sudden, severe headache with no known cause. Nausea or vomiting. Seizure. These symptoms may represent a serious problem that is an emergency. Do not wait to see if the symptoms will go away. Get medical help right away. Call your local emergency services (911 in the [place] Do not drive yourself to the hospital. Summary Orthostatic hypotension is a sudden drop in blood pressure. It can cause light-headedness, sweating, rapid heartbeat, blurred vision, and fainting. Orthostatic hypotension can be diagnosed by having your blood pressure taken while lying down, sitting, and then  standing. Treatment may involve changing your diet, wearing compression stockings, sitting up slowly, adjusting your medicines, or correcting the underlying reason for the orthostatic hypotension. Get help right away if you have chest pain, a fast or irregular heartbeat, or symptoms of a stroke. This information is not intended to replace advice given to you by your health care provider. Make sure you discuss any questions you have with your health care provider. Document Revised: 07/17/2020 Document Reviewed: 07/17/2020 Elsevier Patient Education  [place].

## 2021-11-28 LAB — BASIC METABOLIC PANEL
BUN/Creatinine Ratio: 13 (ref 10–24)
BUN: 11 mg/dL (ref 8–27)
CO2: 21 mmol/L (ref 20–29)
Calcium: 9.6 mg/dL (ref 8.6–10.2)
Chloride: 100 mmol/L (ref 96–106)
Creatinine, Ser: 0.88 mg/dL (ref 0.76–1.27)
Glucose: 149 mg/dL — ABNORMAL HIGH (ref 70–99)
Potassium: 4.1 mmol/L (ref 3.5–5.2)
Sodium: 139 mmol/L (ref 134–144)
eGFR: 97 mL/min/{1.73_m2} (ref >59)

## 2021-11-29 ENCOUNTER — Encounter: Payer: Self-pay | Admitting: Nurse Practitioner

## 2021-11-29 ENCOUNTER — Other Ambulatory Visit: Payer: Self-pay | Admitting: Family Medicine

## 2021-11-29 DIAGNOSIS — E782 Mixed hyperlipidemia: Secondary | ICD-10-CM

## 2021-12-25 ENCOUNTER — Ambulatory Visit (INDEPENDENT_AMBULATORY_CARE_PROVIDER_SITE_OTHER): Payer: BC Managed Care – PPO | Admitting: Nurse Practitioner

## 2021-12-25 VITALS — BP 119/70 | Ht 68.0 in | Wt 183.4 lb

## 2021-12-25 DIAGNOSIS — E782 Mixed hyperlipidemia: Secondary | ICD-10-CM | POA: Diagnosis not present

## 2021-12-25 DIAGNOSIS — I10 Essential (primary) hypertension: Secondary | ICD-10-CM | POA: Diagnosis not present

## 2021-12-25 DIAGNOSIS — E119 Type 2 diabetes mellitus without complications: Secondary | ICD-10-CM

## 2021-12-25 MED ORDER — TRULICITY 0.75 MG/0.5ML ~~LOC~~ SOAJ: 0.7500 mg | SUBCUTANEOUS | 5 refills | Status: DC

## 2021-12-25 NOTE — Progress Notes (Unsigned)
   Subjective:    Patient ID: Tristan Mccarty, male    DOB: 1955/09/05, 67 y.o.   MRN: [id number]  HPI  Presents for recheck on his hypertension.  At last visit decreased his amlodipine to 5 mg daily.  Has brought his BP log in with him today from home, his average is 145/82.  Dizzy spells are much improved.  Had an eye exam in May, has another one scheduled for October.  BP today at home 117/68. Doing well on current dose of Trulicity 3.29 mg.  Has lost about 12 pounds since January.  Most of this occurred after the death of his wife.  States he has decreased appetite and has changed his routine as far as cooking.  Blood sugars averaging 130s fasting.  Adherent to current medication regimen.  Review of Systems  Respiratory:  Negative for cough, chest tightness and shortness of breath.   Cardiovascular:  Negative for chest pain and leg swelling.  Neurological:  Positive for dizziness. Negative for syncope, facial asymmetry, speech difficulty, weakness and numbness.       Mild dizziness much improved from previous visit.       Objective:   Physical Exam NAD.  Alert, oriented.  Lungs clear.  Heart regular rate rhythm, no murmur or gallop noted.  Carotids no bruits or thrills.  Lower extremities no edema. Today's Vitals   12/25/21 1324  BP: 119/70  Weight: 183 lb 6.4 oz (83.2 kg)  Height: '5\' 8"'$  (1.727 m)   Body mass index is 27.89 kg/m.        Assessment & Plan:   Problem List Items Addressed This Visit       Cardiovascular and Mediastinum   Essential hypertension, benign   Relevant Orders   Comprehensive metabolic panel   CBC with Differential/Platelet   Hemoglobin A1c   Lipid panel     Endocrine   Type 2 diabetes mellitus without complications ([place]) - Primary   Relevant Medications   Dulaglutide (TRULICITY) 1.64 YO/0.64YO SOPN   Other Relevant Orders   Comprehensive metabolic panel   CBC with Differential/Platelet   Hemoglobin A1c   Lipid panel     Other    Hyperlipidemia   Relevant Orders   Comprehensive metabolic panel   CBC with Differential/Platelet   Hemoglobin A1c   Lipid panel   Continue current medication regimen as directed.  Although his blood pressure is running slightly above goal at times, with his weight loss and orthostatic hypotension at last visit, no changes to his regimen at this time. Routine labs ordered, advised patient to get these done for his next visit with Dr. Lacinda Axon in the fall.  Call back sooner if any problems.

## 2021-12-26 ENCOUNTER — Encounter: Payer: Self-pay | Admitting: Nurse Practitioner

## 2021-12-29 ENCOUNTER — Other Ambulatory Visit: Payer: Self-pay

## 2021-12-29 MED ORDER — TRULICITY 0.75 MG/0.5ML ~~LOC~~ SOAJ
0.7500 mg | SUBCUTANEOUS | 1 refills | Status: DC
Start: 2021-12-29 — End: 2022-03-23

## 2022-02-11 ENCOUNTER — Other Ambulatory Visit: Payer: Self-pay

## 2022-02-11 DIAGNOSIS — N402 Nodular prostate without lower urinary tract symptoms: Secondary | ICD-10-CM

## 2022-02-15 ENCOUNTER — Other Ambulatory Visit: Payer: BC Managed Care – PPO

## 2022-03-01 ENCOUNTER — Ambulatory Visit: Payer: BC Managed Care – PPO | Admitting: Urology

## 2022-03-16 ENCOUNTER — Other Ambulatory Visit: Payer: Self-pay | Admitting: Family Medicine

## 2022-03-16 DIAGNOSIS — I10 Essential (primary) hypertension: Secondary | ICD-10-CM

## 2022-03-17 ENCOUNTER — Other Ambulatory Visit: Payer: Self-pay

## 2022-03-17 ENCOUNTER — Other Ambulatory Visit: Payer: Self-pay | Admitting: Family Medicine

## 2022-03-17 ENCOUNTER — Telehealth: Payer: Self-pay

## 2022-03-17 DIAGNOSIS — I10 Essential (primary) hypertension: Secondary | ICD-10-CM

## 2022-03-17 DIAGNOSIS — R972 Elevated prostate specific antigen [PSA]: Secondary | ICD-10-CM

## 2022-03-17 NOTE — Telephone Encounter (Signed)
Patient will need a new PSA lab order.  Thanks, Helene Kelp

## 2022-03-17 NOTE — Telephone Encounter (Addendum)
Order in and patient notified.  He will call back to let us know which lab to fax orders.

## 2022-03-17 NOTE — Progress Notes (Addendum)
PSA order expired, new order in and pateint notified.

## 2022-03-17 NOTE — Addendum Note (Signed)
Addended by: Audie Box on: 03/17/2022 02:21 PM   Modules accepted: Orders

## 2022-03-22 ENCOUNTER — Ambulatory Visit: Payer: BC Managed Care – PPO | Admitting: Urology

## 2022-03-23 DIAGNOSIS — R972 Elevated prostate specific antigen [PSA]: Secondary | ICD-10-CM | POA: Diagnosis not present

## 2022-03-24 LAB — PSA: Prostate Specific Ag, Serum: 2.8 ng/mL (ref 0.0–4.0)

## 2022-04-05 ENCOUNTER — Ambulatory Visit (INDEPENDENT_AMBULATORY_CARE_PROVIDER_SITE_OTHER): Payer: BC Managed Care – PPO | Admitting: Urology

## 2022-04-05 ENCOUNTER — Encounter: Payer: Self-pay | Admitting: Urology

## 2022-04-05 VITALS — BP 157/73 | HR 94

## 2022-04-05 DIAGNOSIS — N402 Nodular prostate without lower urinary tract symptoms: Secondary | ICD-10-CM | POA: Diagnosis not present

## 2022-04-05 DIAGNOSIS — R972 Elevated prostate specific antigen [PSA]: Secondary | ICD-10-CM

## 2022-04-05 NOTE — Progress Notes (Unsigned)
04/05/2022 3:10 PM   Tristan Mccarty [birth year] [id number]  Referring provider: Coral Spikes, DO [place],  [place] [address]  No chief complaint on file.   HPI:  F/u -    1) BPH - since 2021. On tamsulosin. No prostate surgery. Some urgency in AM. Noc only 0-1. AUASS = 6.    2) prostate nodule - noted 5 mm left apical Mar 2023 on exam. His PSA was 3.5 in Feb 2023 and 2.4 in Aug 2021. An uncle had PCa and treated with the "implants".     Because of the nodule we set up a prostate MRI March 2023 which was benign (BI-RADS 2) with a 44 g prostate.    3) ED - trouble getting and maintaining an erection. Since 2017. Never tried anything. We discussed pde5i and I sent a Rx but he hasn't tried it.      Today, seen for the above. His Nov 2023 PSA was 2.8. He had some dysuria which cleared. No gross hematuria.    PMH: Past Medical History:  Diagnosis Date   Hyperlipidemia    Hypertension    Reflux    Type 2 diabetes mellitus [facility])     Surgical History: Past Surgical History:  Procedure Laterality Date   BIOPSY  08/03/2021   Procedure: BIOPSY;  Surgeon: Eloise Harman, DO;  [facility];  Service: Endoscopy;;   COLONOSCOPY     COLONOSCOPY WITH PROPOFOL N/A 04/03/2021   Procedure: COLONOSCOPY WITH PROPOFOL;  Surgeon: Eloise Harman, DO;  [facility];  Service: Endoscopy;  Laterality: N/A;  2:00 / ASA II   COLONOSCOPY WITH PROPOFOL N/A 08/03/2021   Procedure: COLONOSCOPY WITH PROPOFOL;  Surgeon: Eloise Harman, DO;  [facility];  Service: Endoscopy;  Laterality: N/A;  9:15am, ASA 2   EYE SURGERY     POLYPECTOMY  04/03/2021   Procedure: POLYPECTOMY;  Surgeon: Eloise Harman, DO;  [facility];  Service: Endoscopy;;  ascending   POLYPECTOMY  08/03/2021   Procedure: POLYPECTOMY;  Surgeon: Eloise Harman, DO;  [facility];  Service: Endoscopy;;    Home Medications:  Allergies as of  04/05/2022       Reactions   Adipex-p [phentermine Hcl]    Upset stomach   Vasotec [enalapril]    Abdominal pain        Medication List        Accurate as of April 05, 2022  3:10 PM. If you have any questions, ask your nurse or doctor.          Advil PM 200-25 MG Caps Generic drug: Ibuprofen-diphenhydrAMINE HCl Take 2 tablets by mouth at bedtime.   amLODipine 5 MG tablet Commonly known as: NORVASC TAKE 1 TABLET TWICE A DAY   gabapentin 300 MG capsule Commonly known as: NEURONTIN Take 1-2 capsules (300-600 mg total) by mouth See admin instructions. Take 1 tablet in the morning and 2 tablets at night.   glipiZIDE 5 MG tablet Commonly known as: GLUCOTROL Take 2 tablets (10 mg total) by mouth 2 (two) times daily before a meal.   losartan 100 MG tablet Commonly known as: COZAAR TAKE 1 TABLET DAILY   metFORMIN 1000 MG tablet Commonly known as: GLUCOPHAGE TAKE 1 TABLET BY MOUTH TWICE A DAY   OneTouch Ultra test strip Generic drug: glucose blood USE TO TEST BLOOD SUGAR DAILY DX:E11.9   simvastatin 40 MG tablet Commonly known  as: ZOCOR TAKE 1 TABLET BY MOUTH EVERY DAY   tamsulosin 0.4 MG Caps capsule Commonly known as: FLOMAX Take 1 capsule (0.4 mg total) by mouth daily.   Trulicity 5.28 UX/3.2GM Sopn Generic drug: Dulaglutide INJECT 0.75 MG UNDER THE SKIN ONCE A WEEK        Allergies:  Allergies  Allergen Reactions   Adipex-P [Phentermine Hcl]     Upset stomach   Vasotec [Enalapril]     Abdominal pain    Family History: Family History  Problem Relation Age of Onset   Cancer Mother        Breast   Hypertension Mother    Heart attack Father    Colon cancer Neg Hx     Social History:  reports that he has never smoked. He has never used smokeless tobacco. He reports that he does not currently use alcohol. He reports that he does not currently use drugs.   Physical Exam: There were no vitals taken for this visit.  Constitutional:  Alert  and oriented, No acute distress. HEENT: [place] AT, moist mucus membranes.  Trachea midline, no masses. Cardiovascular: No clubbing, cyanosis, or edema. Respiratory: Normal respiratory effort, no increased work of breathing. GI: Abdomen is soft, nontender, nondistended, no abdominal masses GU: No CVA tenderness Lymph: No cervical or inguinal lymphadenopathy. Skin: No rashes, bruises or suspicious lesions. Neurologic: Grossly intact, no focal deficits, moving all 4 extremities. Psychiatric: Normal mood and affect. GU: Penis circumcised, normal foreskin, testicles descended bilaterally and palpably normal, bilateral epididymis palpably normal, scrotum normal DRE: Prostate 30 g, smooth but a 5 mm left apical nodule. Might be outside the prostate.    Laboratory Data: Lab Results  Component Value Date   WBC 7.1 07/06/2021   HGB 15.5 07/06/2021   HCT 44.6 07/06/2021   MCV 90 07/06/2021   PLT 157 07/06/2021    Lab Results  Component Value Date   CREATININE 0.88 11/27/2021    Lab Results  Component Value Date   PSA 1.54 02/11/2014    No results found for: "TESTOSTERONE"  Lab Results  Component Value Date   HGBA1C 7.5 (H) 11/03/2021    Urinalysis    Component Value Date/Time   APPEARANCEUR Clear 08/31/2021 1541   GLUCOSEU 2+ (A) 08/31/2021 1541   BILIRUBINUR Negative 08/31/2021 1541   PROTEINUR Negative 08/31/2021 1541   NITRITE Negative 08/31/2021 1541   LEUKOCYTESUR Negative 08/31/2021 1541    Lab Results  Component Value Date   LABMICR Comment 08/31/2021       Assessment & Plan:    Prostate nodule - his exam is stable. PSA lower and normal. See back in 1 year for exam and PSA prior.   No follow-ups on file.  Festus Aloe, MD  [facility]  [address] [place], [place] [address] [phone]

## 2022-04-06 LAB — URINALYSIS, ROUTINE W REFLEX MICROSCOPIC
Bilirubin, UA: NEGATIVE
Glucose, UA: NEGATIVE
Ketones, UA: NEGATIVE
Leukocytes,UA: NEGATIVE
Nitrite, UA: NEGATIVE
Protein,UA: NEGATIVE
RBC, UA: NEGATIVE
Specific Gravity, UA: 1.02 (ref 1.005–1.030)
Urobilinogen, Ur: 0.2 mg/dL (ref 0.2–1.0)
pH, UA: 5.5 (ref 5.0–7.5)

## 2022-05-05 ENCOUNTER — Ambulatory Visit: Payer: BC Managed Care – PPO | Admitting: Family Medicine

## 2022-05-21 ENCOUNTER — Encounter: Payer: Self-pay | Admitting: Nurse Practitioner

## 2022-05-21 ENCOUNTER — Ambulatory Visit (INDEPENDENT_AMBULATORY_CARE_PROVIDER_SITE_OTHER): Payer: BC Managed Care – PPO | Admitting: Nurse Practitioner

## 2022-05-21 VITALS — BP 138/78 | HR 90 | Temp 98.5°F | Wt 197.4 lb

## 2022-05-21 DIAGNOSIS — I10 Essential (primary) hypertension: Secondary | ICD-10-CM

## 2022-05-21 DIAGNOSIS — E782 Mixed hyperlipidemia: Secondary | ICD-10-CM

## 2022-05-21 DIAGNOSIS — S80811A Abrasion, right lower leg, initial encounter: Secondary | ICD-10-CM | POA: Diagnosis not present

## 2022-05-21 DIAGNOSIS — E119 Type 2 diabetes mellitus without complications: Secondary | ICD-10-CM | POA: Diagnosis not present

## 2022-05-21 DIAGNOSIS — Z23 Encounter for immunization: Secondary | ICD-10-CM

## 2022-05-21 NOTE — Progress Notes (Signed)
Subjective:    Patient ID: Tristan Mccarty, male    DOB: 06-19-1955, 67 y.o.   MRN: [id number]  HPI Patient is here for 6 routine follow up. Medication refills.  Presents for routine follow-up of his chronic health conditions.  States he is doing much better mentally since losing his wife.  Has been focused on the good memories.  Adherent to medication regimen.  Staying active.  Appetite has improved. Is due for a tetanus vaccine, has a slight abrasion on his right leg.   Review of Systems  Respiratory:  Negative for cough, chest tightness, shortness of breath and wheezing.   Cardiovascular:  Positive for leg swelling. Negative for chest pain.       Mild swelling in the lower extremities mainly at the end of the day.      05/21/2022    3:00 PM  Depression screen PHQ 2/9  Decreased Interest 0  Down, Depressed, Hopeless 0  PHQ - 2 Score 0  Altered sleeping 0  Tired, decreased energy 0  Change in appetite 0  Feeling bad or failure about yourself  0  Trouble concentrating 0  Moving slowly or fidgety/restless 0  Suicidal thoughts 0  PHQ-9 Score 0  Difficult doing work/chores Not difficult at all      05/21/2022    3:00 PM  GAD 7 : Generalized Anxiety Score  Nervous, Anxious, on Edge 0  Control/stop worrying 0  Worry too much - different things 0  Trouble relaxing 0  Restless 0  Easily annoyed or irritable 0  Afraid - awful might happen 0  Total GAD 7 Score 0  Anxiety Difficulty Not difficult at all        Objective:   Physical Exam NAD.  Alert, oriented.  Cheerful affect.  Thyroid nontender to palpation, no mass or goiter noted.  Lungs clear.  Heart regular rate rhythm.  Lower extremities no edema.  A small approximately 1 cm superficial abrasion noted on the right pretibial area.  No evidence of infection. Today's Vitals   05/21/22 1452 05/21/22 1502  BP: (!) 150/70 138/78  Pulse: 90   Temp: 98.5 F (36.9 C)   TempSrc: Oral   SpO2: 99%   Weight: 197 lb 6.4 oz  (89.5 kg)    Body mass index is 30.01 kg/m.  Diabetic Foot Exam - Simple   Simple Foot Form Visual Inspection No deformities, no ulcerations, no other skin breakdown bilaterally: Yes Sensation Testing Intact to touch and monofilament testing bilaterally: Yes Pulse Check See comments: Yes Comments DP pulses present bilaterally.          Assessment & Plan:   Problem List Items Addressed This Visit       Cardiovascular and Mediastinum   Essential hypertension, benign - Primary   Relevant Orders   CBC with Differential   Comprehensive metabolic panel     Endocrine   Type 2 diabetes mellitus without complications ([place])   Relevant Orders   CBC with Differential   Comprehensive metabolic panel   Hemoglobin A1c   Microalbumin / creatinine urine ratio     Other   Hyperlipidemia   Relevant Orders   CBC with Differential   Lipid panel   Other Visit Diagnoses     Abrasion of right lower extremity, initial encounter       Relevant Orders   Tdap vaccine greater than or equal to 7yo IM (Completed)      Tdap vaccine today.  Call back if  any signs of infection at the site of the abrasion. Labs pending. States his routine eye exam was rescheduled by the office, wants to get this done in the near future. Return in about 3 months (around 08/20/2022). Call back sooner if needed.

## 2022-05-28 ENCOUNTER — Other Ambulatory Visit: Payer: Self-pay | Admitting: Family Medicine

## 2022-05-28 DIAGNOSIS — E782 Mixed hyperlipidemia: Secondary | ICD-10-CM

## 2022-05-28 DIAGNOSIS — E119 Type 2 diabetes mellitus without complications: Secondary | ICD-10-CM

## 2022-05-31 DIAGNOSIS — E119 Type 2 diabetes mellitus without complications: Secondary | ICD-10-CM | POA: Diagnosis not present

## 2022-05-31 LAB — HM DIABETES EYE EXAM

## 2022-06-10 ENCOUNTER — Other Ambulatory Visit: Payer: Self-pay | Admitting: Family Medicine

## 2022-06-10 DIAGNOSIS — M792 Neuralgia and neuritis, unspecified: Secondary | ICD-10-CM

## 2022-06-16 ENCOUNTER — Encounter: Payer: Self-pay | Admitting: *Deleted

## 2022-07-27 ENCOUNTER — Telehealth: Payer: Self-pay

## 2022-07-27 ENCOUNTER — Other Ambulatory Visit: Payer: Self-pay | Admitting: Family Medicine

## 2022-07-27 MED ORDER — TRULICITY 0.75 MG/0.5ML ~~LOC~~ SOAJ: 0.7500 mg | SUBCUTANEOUS | 3 refills | Status: DC

## 2022-07-27 NOTE — Telephone Encounter (Signed)
Prescription Request  07/27/2022  LOV: Visit date not found  What is the name of the medication or equipment? TRULICITY [id number] SOPN Needs three month supply [facility] will not fill with just one month   Have you contacted your pharmacy to request a refill? Yes Which pharmacy would you like this sent to?  [facility]   Patient notified that their request is being sent to the clinical staff for review and that they should receive a response within 2 business days.   Please advise at Mobile [phone] (mobile)

## 2022-07-29 NOTE — Telephone Encounter (Signed)
Cook, Jayce G, DO     Rx sent.   

## 2022-07-29 NOTE — Telephone Encounter (Signed)
Patient notified

## 2022-08-20 ENCOUNTER — Ambulatory Visit: Payer: BC Managed Care – PPO | Admitting: Nurse Practitioner

## 2022-08-26 ENCOUNTER — Other Ambulatory Visit: Payer: Self-pay | Admitting: Family Medicine

## 2022-08-26 DIAGNOSIS — E782 Mixed hyperlipidemia: Secondary | ICD-10-CM

## 2022-08-26 DIAGNOSIS — E119 Type 2 diabetes mellitus without complications: Secondary | ICD-10-CM

## 2022-08-26 DIAGNOSIS — I10 Essential (primary) hypertension: Secondary | ICD-10-CM

## 2022-08-31 DIAGNOSIS — E119 Type 2 diabetes mellitus without complications: Secondary | ICD-10-CM | POA: Diagnosis not present

## 2022-08-31 DIAGNOSIS — E782 Mixed hyperlipidemia: Secondary | ICD-10-CM | POA: Diagnosis not present

## 2022-08-31 DIAGNOSIS — I10 Essential (primary) hypertension: Secondary | ICD-10-CM | POA: Diagnosis not present

## 2022-09-10 ENCOUNTER — Ambulatory Visit (INDEPENDENT_AMBULATORY_CARE_PROVIDER_SITE_OTHER): Payer: BC Managed Care – PPO | Admitting: Nurse Practitioner

## 2022-09-10 VITALS — BP 138/88 | Wt 197.0 lb

## 2022-09-10 DIAGNOSIS — E782 Mixed hyperlipidemia: Secondary | ICD-10-CM | POA: Diagnosis not present

## 2022-09-10 DIAGNOSIS — I10 Essential (primary) hypertension: Secondary | ICD-10-CM | POA: Diagnosis not present

## 2022-09-10 DIAGNOSIS — E119 Type 2 diabetes mellitus without complications: Secondary | ICD-10-CM

## 2022-09-10 NOTE — Progress Notes (Unsigned)
Subjective:    Patient ID: Tristan Mccarty, male    DOB: 1956-01-25, 67 y.o.   MRN: [id number]  HPI Presents for routine follow-up of his hypertension hyperlipidemia and diabetes.  Also has lab work for review today.  Has been eating out a lot more since losing his wife.  Does not cook at home as much as he used to.  Staying very active sometimes to the point of exhaustion to keep his mind busy.  Does not check his BP outside of the office.  Has had 2 episodes of low blood sugar always occurring later in the day, 1 number was in the 60s and the other was 71.  Admits that he may not had been eating properly during those days.  States he does drink plenty of fluids such as water.  Adherent to medication regimen.  Does have coffee with sugar in the mornings.  Stopped drinking sodas years ago.  Does have occasional KitKat are as his treat.  Gets regular diabetic eye exams.  Review of Systems  Constitutional:  Positive for fatigue.  Respiratory:  Negative for cough, chest tightness, shortness of breath and wheezing.   Cardiovascular:  Positive for leg swelling. Negative for chest pain.       Has occasional mild swelling of the legs but this has improved.  Has switched to diabetic socks because of the tightness in his lower extremities which caused severe itching at times.  This has basically resolved.       Objective:   Physical Exam NAD.  Alert, oriented.  Calm cheerful affect.  Lungs clear.  Heart regular rate rhythm.  Carotids no bruits or thrills. Today's Vitals   09/10/22 1517  BP: 138/88  Weight: 197 lb (89.4 kg)   Body mass index is 29.95 kg/m.  Recent Results (from the past 2160 hour(s))  CBC with Differential     Status: None   Collection Time: 08/31/22  9:20 AM  Result Value Ref Range   WBC 6.4 3.4 - 10.8 x10E3/uL   RBC 4.50 4.14 - 5.80 x10E6/uL   Hemoglobin 14.2 13.0 - 17.7 g/dL   Hematocrit 47.8 29.5 - 51.0 %   MCV 95 79 - 97 fL   MCH 31.6 26.6 - 33.0 pg   MCHC 33.3 31.5 -  35.7 g/dL   RDW 62.1 30.8 - 65.7 %   Platelets 174 150 - 450 x10E3/uL   Neutrophils 55 Not Estab. %   Lymphs 29 Not Estab. %   Monocytes 8 Not Estab. %   Eos 7 Not Estab. %   Basos 1 Not Estab. %   Neutrophils Absolute 3.6 1.4 - 7.0 x10E3/uL   Lymphocytes Absolute 1.9 0.7 - 3.1 x10E3/uL   Monocytes Absolute 0.5 0.1 - 0.9 x10E3/uL   EOS (ABSOLUTE) 0.4 0.0 - 0.4 x10E3/uL   Basophils Absolute 0.1 0.0 - 0.2 x10E3/uL   Immature Granulocytes 0 Not Estab. %   Immature Grans (Abs) 0.0 0.0 - 0.1 x10E3/uL  Comprehensive metabolic panel     Status: Abnormal   Collection Time: 08/31/22  9:20 AM  Result Value Ref Range   Glucose 162 (H) 70 - 99 mg/dL   BUN 13 8 - 27 mg/dL   Creatinine, Ser 8.46 0.76 - 1.27 mg/dL   eGFR 97 >96 EX/BMW/4.13   BUN/Creatinine Ratio 15 10 - 24   Sodium 142 134 - 144 mmol/L   Potassium 4.5 3.5 - 5.2 mmol/L   Chloride 105 96 - 106 mmol/L  CO2 21 20 - 29 mmol/L   Calcium 9.8 8.6 - 10.2 mg/dL   Total Protein 7.1 6.0 - 8.5 g/dL   Albumin 4.7 3.9 - 4.9 g/dL   Globulin, Total 2.4 1.5 - 4.5 g/dL   Albumin/Globulin Ratio 2.0 1.2 - 2.2   Bilirubin Total 0.2 0.0 - 1.2 mg/dL   Alkaline Phosphatase 59 44 - 121 IU/L   AST 16 0 - 40 IU/L   ALT 18 0 - 44 IU/L  Lipid panel     Status: None   Collection Time: 08/31/22  9:20 AM  Result Value Ref Range   Cholesterol, Total 135 100 - 199 mg/dL   Triglycerides 99 0 - 149 mg/dL   HDL 46 >40 mg/dL   VLDL Cholesterol Cal 18 5 - 40 mg/dL   LDL Chol Calc (NIH) 71 0 - 99 mg/dL   Chol/HDL Ratio 2.9 0.0 - 5.0 ratio    Comment:                                   T. Chol/HDL Ratio                                             Men  Women                               1/2 Avg.Risk  3.4    3.3                                   Avg.Risk  5.0    4.4                                2X Avg.Risk  9.6    7.1                                3X Avg.Risk 23.4   11.0   Hemoglobin A1c     Status: Abnormal   Collection Time: 08/31/22  9:20 AM   Result Value Ref Range   Hgb A1c MFr Bld 7.6 (H) 4.8 - 5.6 %    Comment:          Prediabetes: 5.7 - 6.4          Diabetes: >6.4          Glycemic control for adults with diabetes: <7.0    Est. average glucose Bld gHb Est-mCnc 171 mg/dL   Note the patient was unable to void to obtain a urine ACR at that time.       Assessment & Plan:   Problem List Items Addressed This Visit       Cardiovascular and Mediastinum   Essential hypertension, benign     Endocrine   Type 2 diabetes mellitus without complications (HCC) - Primary   Relevant Orders   Microalbumin / creatinine urine ratio     Other   Hyperlipidemia   Continue current medication regimen as directed. Encourage patient to eat a healthy well-balanced diet including whole foods.  Also encouraged him to limit sugar  and simple carbs.  Reviewed lab work with patient during the visit.  Recommend regular meals plus healthy snacks.  His A1c is stable today but for his age, recommend a goal of less than 7%.  Continue to monitor his blood sugars outside of the office. Also reminded patient about zoster vaccine. Return in about 6 months (around 03/12/2023). Call back sooner if needed.

## 2022-09-11 ENCOUNTER — Encounter: Payer: Self-pay | Admitting: Nurse Practitioner

## 2022-09-11 LAB — MICROALBUMIN / CREATININE URINE RATIO
Creatinine, Urine: 36.8 mg/dL
Microalb/Creat Ratio: <8 mg/g creat (ref 0–29)
Microalbumin, Urine: <3 ug/mL

## 2022-09-14 LAB — COMPREHENSIVE METABOLIC PANEL
ALT: 18 IU/L (ref 0–44)
AST: 16 IU/L (ref 0–40)
Albumin/Globulin Ratio: 2 (ref 1.2–2.2)
Albumin: 4.7 g/dL (ref 3.9–4.9)
Alkaline Phosphatase: 59 IU/L (ref 44–121)
BUN/Creatinine Ratio: 15 (ref 10–24)
BUN: 13 mg/dL (ref 8–27)
Bilirubin Total: 0.2 mg/dL (ref 0.0–1.2)
CO2: 21 mmol/L (ref 20–29)
Calcium: 9.8 mg/dL (ref 8.6–10.2)
Chloride: 105 mmol/L (ref 96–106)
Creatinine, Ser: 0.85 mg/dL (ref 0.76–1.27)
Globulin, Total: 2.4 g/dL (ref 1.5–4.5)
Glucose: 162 mg/dL — ABNORMAL HIGH (ref 70–99)
Potassium: 4.5 mmol/L (ref 3.5–5.2)
Sodium: 142 mmol/L (ref 134–144)
Total Protein: 7.1 g/dL (ref 6.0–8.5)
eGFR: 97 mL/min/{1.73_m2} (ref >59)

## 2022-09-14 LAB — LIPID PANEL
Chol/HDL Ratio: 2.9 ratio (ref 0.0–5.0)
Cholesterol, Total: 135 mg/dL (ref 100–199)
HDL: 46 mg/dL (ref >39)
LDL Chol Calc (NIH): 71 mg/dL (ref 0–99)
Triglycerides: 99 mg/dL (ref 0–149)
VLDL Cholesterol Cal: 18 mg/dL (ref 5–40)

## 2022-09-14 LAB — CBC WITH DIFFERENTIAL/PLATELET
Basophils Absolute: 0.1 10*3/uL (ref 0.0–0.2)
Basos: 1 %
EOS (ABSOLUTE): 0.4 10*3/uL (ref 0.0–0.4)
Eos: 7 %
Hematocrit: 42.6 % (ref 37.5–51.0)
Hemoglobin: 14.2 g/dL (ref 13.0–17.7)
Immature Grans (Abs): 0 10*3/uL (ref 0.0–0.1)
Immature Granulocytes: 0 %
Lymphocytes Absolute: 1.9 10*3/uL (ref 0.7–3.1)
Lymphs: 29 %
MCH: 31.6 pg (ref 26.6–33.0)
MCHC: 33.3 g/dL (ref 31.5–35.7)
MCV: 95 fL (ref 79–97)
Monocytes Absolute: 0.5 10*3/uL (ref 0.1–0.9)
Monocytes: 8 %
Neutrophils Absolute: 3.6 10*3/uL (ref 1.4–7.0)
Neutrophils: 55 %
Platelets: 174 10*3/uL (ref 150–450)
RBC: 4.5 x10E6/uL (ref 4.14–5.80)
RDW: 12.1 % (ref 11.6–15.4)
WBC: 6.4 10*3/uL (ref 3.4–10.8)

## 2022-09-14 LAB — MICROALBUMIN / CREATININE URINE RATIO

## 2022-09-14 LAB — SPECIMEN STATUS REPORT

## 2022-09-14 LAB — HEMOGLOBIN A1C
Est. average glucose Bld gHb Est-mCnc: 171 mg/dL
Hgb A1c MFr Bld: 7.6 % — ABNORMAL HIGH (ref 4.8–5.6)

## 2022-10-05 ENCOUNTER — Other Ambulatory Visit: Payer: Self-pay | Admitting: Nurse Practitioner

## 2022-10-05 DIAGNOSIS — M792 Neuralgia and neuritis, unspecified: Secondary | ICD-10-CM

## 2022-10-06 LAB — HM DIABETES EYE EXAM

## 2022-11-10 ENCOUNTER — Telehealth: Payer: Self-pay | Admitting: Family Medicine

## 2022-11-10 NOTE — Telephone Encounter (Signed)
Patient has physical in August  and needing labs.

## 2022-11-11 ENCOUNTER — Other Ambulatory Visit: Payer: Self-pay

## 2022-11-11 ENCOUNTER — Encounter: Payer: Self-pay | Admitting: *Deleted

## 2022-11-11 DIAGNOSIS — E119 Type 2 diabetes mellitus without complications: Secondary | ICD-10-CM

## 2022-11-11 NOTE — Telephone Encounter (Signed)
Informed per drs orders , labs ordered.

## 2022-11-22 ENCOUNTER — Encounter: Payer: Self-pay | Admitting: Family Medicine

## 2022-11-23 DIAGNOSIS — E119 Type 2 diabetes mellitus without complications: Secondary | ICD-10-CM | POA: Diagnosis not present

## 2022-11-24 ENCOUNTER — Other Ambulatory Visit: Payer: Self-pay | Admitting: Family Medicine

## 2022-11-24 DIAGNOSIS — I10 Essential (primary) hypertension: Secondary | ICD-10-CM

## 2022-11-24 DIAGNOSIS — E782 Mixed hyperlipidemia: Secondary | ICD-10-CM

## 2022-11-24 DIAGNOSIS — E119 Type 2 diabetes mellitus without complications: Secondary | ICD-10-CM

## 2022-11-24 LAB — HEMOGLOBIN A1C
Est. average glucose Bld gHb Est-mCnc: 180 mg/dL
Hgb A1c MFr Bld: 7.9 % — ABNORMAL HIGH (ref 4.8–5.6)

## 2022-12-02 ENCOUNTER — Other Ambulatory Visit: Payer: Self-pay

## 2022-12-02 MED ORDER — TRULICITY 0.75 MG/0.5ML ~~LOC~~ SOAJ: 1.5000 mg | SUBCUTANEOUS | 3 refills | Status: DC

## 2022-12-06 ENCOUNTER — Other Ambulatory Visit: Payer: Self-pay | Admitting: Family Medicine

## 2022-12-06 MED ORDER — TRULICITY 1.5 MG/0.5ML ~~LOC~~ SOAJ: 1.5000 mg | SUBCUTANEOUS | 1 refills | Status: DC

## 2022-12-27 ENCOUNTER — Ambulatory Visit (INDEPENDENT_AMBULATORY_CARE_PROVIDER_SITE_OTHER): Payer: BC Managed Care – PPO | Admitting: Family Medicine

## 2022-12-27 VITALS — BP 150/76 | HR 86 | Temp 97.7°F | Ht 66.5 in | Wt 192.6 lb

## 2022-12-27 DIAGNOSIS — Z0001 Encounter for general adult medical examination with abnormal findings: Secondary | ICD-10-CM

## 2022-12-27 DIAGNOSIS — Z0000 Encounter for general adult medical examination without abnormal findings: Secondary | ICD-10-CM | POA: Insufficient documentation

## 2022-12-27 DIAGNOSIS — E119 Type 2 diabetes mellitus without complications: Secondary | ICD-10-CM

## 2022-12-27 MED ORDER — FREESTYLE LIBRE 3 SENSOR MISC: 3 refills | Status: DC

## 2022-12-27 MED ORDER — TRULICITY 3 MG/0.5ML ~~LOC~~ SOAJ: 3.0000 mg | SUBCUTANEOUS | 3 refills | Status: DC

## 2022-12-27 NOTE — Progress Notes (Signed)
Subjective:  Patient ID: Tristan Mccarty, male    DOB: [birth year]  Age: 67 y.o. MRN: [id number]  CC:  Physical   HPI:  67 year old male with hypertension, GERD, type 2 diabetes, BPH, hyperlipidemia presents for follow-up.  Patient last A1c was elevated at 7.9.  He endorses compliance with Trulicity 1.5 mg weekly, glipizide 10 mg twice daily, and metformin.  Patient reports that he has had hypoglycemia 1-2 times a month.  This is likely due to the fact that he has times where he takes his glipizide without regard to meals.  Patient reports that he would like a continuous glucometer given the hypoglycemic episodes.  BP mildly elevated here today.  He is on amlodipine 5 mg daily.  He is also on losartan 100 mg daily.  Lipids have been quite well-controlled on simvastatin.  Last LDL was 71.  We had a discussion today regarding his preventative health care.  Declines HIV and hepatitis C screening.  Declines vaccines at this time.  Patient Active Problem List   Diagnosis Date Noted   Annual physical exam 12/27/2022   GERD (gastroesophageal reflux disease) 07/06/2021   BPH (benign prostatic hyperplasia) 07/06/2021   Hyperlipidemia 05/13/2013   Essential hypertension, benign 05/13/2013   Type 2 diabetes mellitus without complications (HCC) 05/13/2013    Social Hx   Social History   Socioeconomic History   Marital status: Married    Spouse name: Not on file   Number of children: Not on file   Years of education: Not on file   Highest education level: Not on file  Occupational History   Not on file  Tobacco Use   Smoking status: Never   Smokeless tobacco: Never  Vaping Use   Vaping status: Never Used  Substance and Sexual Activity   Alcohol use: Not Currently   Drug use: Not Currently   Sexual activity: Yes  Other Topics Concern   Not on file  Social History Narrative   Not on file   Social Determinants of Health   Financial Resource Strain: Not on file  Food  Insecurity: Not on file  Transportation Needs: Not on file  Physical Activity: Not on file  Stress: Not on file  Social Connections: Not on file    Review of Systems  Constitutional: Negative.   Respiratory: Negative.    Cardiovascular: Negative.    Objective:  BP (!) 150/76   Pulse 86   Temp 97.7 F (36.5 C)   Ht 5' 6.5" (1.689 m)   Wt 192 lb 9.6 oz (87.4 kg)   SpO2 97%   BMI 30.62 kg/m      12/27/2022    2:08 PM 12/27/2022    1:19 PM 09/10/2022    3:17 PM  BP/Weight  Systolic BP 150 144 138  Diastolic BP 76 77 88  Wt. (Lbs)  192.6 197  BMI  30.62 kg/m2 29.95 kg/m2    Physical Exam Vitals and nursing note reviewed.  Constitutional:      General: He is not in acute distress.    Appearance: Normal appearance.  HENT:     Head: Normocephalic and atraumatic.  Eyes:     General:        Right eye: No discharge.        Left eye: No discharge.     Conjunctiva/sclera: Conjunctivae normal.  Cardiovascular:     Rate and Rhythm: Normal rate and regular rhythm.  Pulmonary:     Effort: Pulmonary effort is normal.  Breath sounds: Normal breath sounds. No wheezing, rhonchi or rales.  Neurological:     Mental Status: He is alert.  Psychiatric:        Mood and Affect: Mood normal.        Behavior: Behavior normal.     Lab Results  Component Value Date   WBC 6.4 08/31/2022   HGB 14.2 08/31/2022   HCT 42.6 08/31/2022   PLT 174 08/31/2022   GLUCOSE 162 (H) 08/31/2022   CHOL 135 08/31/2022   TRIG 99 08/31/2022   HDL 46 08/31/2022   LDLCALC 71 08/31/2022   ALT 18 08/31/2022   AST 16 08/31/2022   NA 142 08/31/2022   K 4.5 08/31/2022   CL 105 08/31/2022   CREATININE 0.85 08/31/2022   BUN 13 08/31/2022   CO2 21 08/31/2022   PSA 1.54 02/11/2014   HGBA1C 7.9 (H) 11/23/2022   MICROALBUR 0.5 06/29/2014     Assessment & Plan:   Problem List Items Addressed This Visit       Endocrine   Type 2 diabetes mellitus without complications (HCC)    Uncontrolled.   Having hypoglycemia.  Rx sent for continuous glucometer.  Advised to be sure that he does not go without eating when he takes his glipizide.  Increasing Trulicity.      Relevant Medications   Dulaglutide (TRULICITY) 3 MG/0.5ML SOPN     Other   Annual physical exam - Primary    Overall, he is doing okay at this time.  Preventative healthcare items stated.  Rx sent for continuous glucometer.        Meds ordered this encounter  Medications   Continuous Glucose Sensor (FREESTYLE LIBRE 3 SENSOR) MISC    Sig: Place 1 sensor on the skin every 14 days. Use to check glucose continuously    Dispense:  2 each    Refill:  3   Dulaglutide (TRULICITY) 3 MG/0.5ML SOPN    Sig: Inject 3 mg as directed once a week.    Dispense:  6 mL    Refill:  3    Follow-up:  3 months   Adriana Simas DO [facility]

## 2022-12-27 NOTE — Patient Instructions (Signed)
I am going to increase the Trulicity.  Be sure to eat with the Glipizide.  Follow up in 3 months.

## 2022-12-27 NOTE — Assessment & Plan Note (Signed)
Uncontrolled.  Having hypoglycemia.  Rx sent for continuous glucometer.  Advised to be sure that he does not go without eating when he takes his glipizide.  Increasing Trulicity.

## 2022-12-27 NOTE — Assessment & Plan Note (Addendum)
Overall, he is doing okay at this time.  Preventative healthcare items stated.  Rx sent for continuous glucometer.

## 2023-01-20 ENCOUNTER — Telehealth: Payer: Self-pay

## 2023-01-20 NOTE — Telephone Encounter (Signed)
Pt called said the pharmacy said write for 6 units of Libre  Glucose Monitor   Pt call back [phone]

## 2023-01-21 ENCOUNTER — Other Ambulatory Visit: Payer: Self-pay | Admitting: Family Medicine

## 2023-01-23 ENCOUNTER — Other Ambulatory Visit: Payer: Self-pay | Admitting: Family Medicine

## 2023-02-03 ENCOUNTER — Other Ambulatory Visit: Payer: Self-pay | Admitting: Family Medicine

## 2023-02-03 MED ORDER — FREESTYLE LIBRE 3 SENSOR MISC: 3 refills | Status: DC

## 2023-02-28 ENCOUNTER — Other Ambulatory Visit: Payer: Self-pay | Admitting: Family Medicine

## 2023-02-28 DIAGNOSIS — I10 Essential (primary) hypertension: Secondary | ICD-10-CM

## 2023-03-29 ENCOUNTER — Ambulatory Visit (INDEPENDENT_AMBULATORY_CARE_PROVIDER_SITE_OTHER): Payer: BC Managed Care – PPO | Admitting: Family Medicine

## 2023-03-29 VITALS — BP 130/60 | HR 82 | Temp 97.7°F | Ht 66.5 in | Wt 189.0 lb

## 2023-03-29 DIAGNOSIS — Z7984 Long term (current) use of oral hypoglycemic drugs: Secondary | ICD-10-CM | POA: Diagnosis not present

## 2023-03-29 DIAGNOSIS — E119 Type 2 diabetes mellitus without complications: Secondary | ICD-10-CM

## 2023-03-29 DIAGNOSIS — I10 Essential (primary) hypertension: Secondary | ICD-10-CM

## 2023-03-29 DIAGNOSIS — E1159 Type 2 diabetes mellitus with other circulatory complications: Secondary | ICD-10-CM

## 2023-03-29 DIAGNOSIS — E782 Mixed hyperlipidemia: Secondary | ICD-10-CM | POA: Diagnosis not present

## 2023-03-29 MED ORDER — TRULICITY 1.5 MG/0.5ML ~~LOC~~ SOAJ: 1.5000 mg | SUBCUTANEOUS | 1 refills | Status: DC

## 2023-03-29 NOTE — Patient Instructions (Signed)
I sent in the Trulicity.  Follow up in 3 months.

## 2023-03-30 NOTE — Assessment & Plan Note (Signed)
Stable on current medications. Continue.

## 2023-03-30 NOTE — Assessment & Plan Note (Signed)
Uncontrolled.  Not at goal.  Increasing Trulicity.

## 2023-03-30 NOTE — Assessment & Plan Note (Signed)
Continue statin. 

## 2023-03-30 NOTE — Progress Notes (Signed)
Subjective:  Patient ID: Tristan Mccarty, male    DOB: 1955-08-22  Age: 67 y.o. MRN: [id number]  CC:   Chief Complaint  Patient presents with   6 month check up    Discussed grief and loss of spouse   Diabetes    Refill trulicity 0.75 ml [facility]    HPI:  67 year old male with below mentioned medical problems presents for follow-up.  Last A1c was 7.9.  Patient has his phone with him today showing his freestyle libre readings.  His blood sugars are uncontrolled.  He is currently on glipizide and Trulicity.  For some reason, he is back on the 0.75 mg dose.  Will plan to increase today.  No hypoglycemia.  Patient's blood pressure is well-controlled on amlodipine and losartan.  Lipids fairly well-controlled.  Last LDL was 71.  He is on simvastatin.  Patient Active Problem List   Diagnosis Date Noted   GERD (gastroesophageal reflux disease) 07/06/2021   BPH (benign prostatic hyperplasia) 07/06/2021   Hyperlipidemia 05/13/2013   Essential hypertension, benign 05/13/2013   Type 2 diabetes mellitus without complications (HCC) 05/13/2013    Social Hx   Social History   Socioeconomic History   Marital status: Married    Spouse name: Not on file   Number of children: Not on file   Years of education: Not on file   Highest education level: Not on file  Occupational History   Not on file  Tobacco Use   Smoking status: Never   Smokeless tobacco: Never  Vaping Use   Vaping status: Never Used  Substance and Sexual Activity   Alcohol use: Not Currently   Drug use: Not Currently   Sexual activity: Yes  Other Topics Concern   Not on file  Social History Narrative   Not on file   Social Determinants of Health   Financial Resource Strain: Not on file  Food Insecurity: Not on file  Transportation Needs: Not on file  Physical Activity: Not on file  Stress: Not on file  Social Connections: Not on file    Review of Systems  Constitutional: Negative.   Respiratory:  Negative.    Cardiovascular: Negative.    Objective:  BP 130/60   Pulse 82   Temp 97.7 F (36.5 C)   Ht 5' 6.5" (1.689 m)   Wt 189 lb (85.7 kg)   SpO2 98%   BMI 30.05 kg/m      03/29/2023    4:04 PM 03/29/2023    3:43 PM 12/27/2022    2:08 PM  BP/Weight  Systolic BP 130 169 150  Diastolic BP 60 75 76  Wt. (Lbs)  189   BMI  30.05 kg/m2     Physical Exam Vitals and nursing note reviewed.  Constitutional:      General: He is not in acute distress.    Appearance: Normal appearance.  HENT:     Head: Normocephalic and atraumatic.  Eyes:     General:        Right eye: No discharge.        Left eye: No discharge.     Conjunctiva/sclera: Conjunctivae normal.  Cardiovascular:     Rate and Rhythm: Normal rate and regular rhythm.  Pulmonary:     Effort: Pulmonary effort is normal.     Breath sounds: Normal breath sounds. No wheezing, rhonchi or rales.  Neurological:     Mental Status: He is alert.  Psychiatric:  Mood and Affect: Mood normal.        Behavior: Behavior normal.     Lab Results  Component Value Date   WBC 6.4 08/31/2022   HGB 14.2 08/31/2022   HCT 42.6 08/31/2022   PLT 174 08/31/2022   GLUCOSE 162 (H) 08/31/2022   CHOL 135 08/31/2022   TRIG 99 08/31/2022   HDL 46 08/31/2022   LDLCALC 71 08/31/2022   ALT 18 08/31/2022   AST 16 08/31/2022   NA 142 08/31/2022   K 4.5 08/31/2022   CL 105 08/31/2022   CREATININE 0.85 08/31/2022   BUN 13 08/31/2022   CO2 21 08/31/2022   PSA 1.54 02/11/2014   HGBA1C 7.9 (H) 11/23/2022   MICROALBUR 0.5 06/29/2014     Assessment & Plan:   Problem List Items Addressed This Visit       Cardiovascular and Mediastinum   Essential hypertension, benign    Stable on current medications.  Continue.        Endocrine   Type 2 diabetes mellitus without complications (HCC) - Primary    Uncontrolled.  Not at goal.  Increasing Trulicity.      Relevant Medications   Dulaglutide (TRULICITY) 1.5 MG/0.5ML SOAJ      Other   Hyperlipidemia    Continue statin.       Meds ordered this encounter  Medications   Dulaglutide (TRULICITY) 1.5 MG/0.5ML SOAJ    Sig: Inject 1.5 mg into the skin once a week.    Dispense:  6 mL    Refill:  1    Follow-up:  Return in about 3 months (around 06/29/2023) for Diabetes follow up, HTN follow up.  Everlene Other DO [facility]

## 2023-04-04 ENCOUNTER — Ambulatory Visit: Payer: BC Managed Care – PPO | Admitting: Urology

## 2023-06-29 ENCOUNTER — Encounter: Payer: Self-pay | Admitting: Family Medicine

## 2023-06-29 ENCOUNTER — Ambulatory Visit (INDEPENDENT_AMBULATORY_CARE_PROVIDER_SITE_OTHER): Payer: BC Managed Care – PPO | Admitting: Family Medicine

## 2023-06-29 VITALS — BP 135/72 | HR 67 | Temp 97.7°F | Ht 66.5 in | Wt 193.0 lb

## 2023-06-29 DIAGNOSIS — I10 Essential (primary) hypertension: Secondary | ICD-10-CM

## 2023-06-29 DIAGNOSIS — E119 Type 2 diabetes mellitus without complications: Secondary | ICD-10-CM

## 2023-06-29 DIAGNOSIS — Z7985 Long-term (current) use of injectable non-insulin antidiabetic drugs: Secondary | ICD-10-CM | POA: Diagnosis not present

## 2023-06-29 DIAGNOSIS — E782 Mixed hyperlipidemia: Secondary | ICD-10-CM

## 2023-06-29 DIAGNOSIS — E1169 Type 2 diabetes mellitus with other specified complication: Secondary | ICD-10-CM | POA: Diagnosis not present

## 2023-06-29 MED ORDER — PANTOPRAZOLE SODIUM 40 MG PO TBEC: 40.0000 mg | DELAYED_RELEASE_TABLET | Freq: Every day | ORAL | 1 refills | Status: DC

## 2023-06-29 NOTE — Patient Instructions (Signed)
Labs today.  Continue your medications.  Follow up in 3 months.

## 2023-06-30 LAB — BASIC METABOLIC PANEL
BUN/Creatinine Ratio: 12 (ref 10–24)
BUN: 10 mg/dL (ref 8–27)
CO2: 24 mmol/L (ref 20–29)
Calcium: 9.8 mg/dL (ref 8.6–10.2)
Chloride: 101 mmol/L (ref 96–106)
Creatinine, Ser: 0.84 mg/dL (ref 0.76–1.27)
Glucose: 89 mg/dL (ref 70–99)
Potassium: 4.3 mmol/L (ref 3.5–5.2)
Sodium: 141 mmol/L (ref 134–144)
eGFR: 97 mL/min/{1.73_m2} (ref >59)

## 2023-06-30 LAB — MICROALBUMIN / CREATININE URINE RATIO
Creatinine, Urine: 205.5 mg/dL
Microalb/Creat Ratio: 4 mg/g{creat} (ref 0–29)
Microalbumin, Urine: 8.9 ug/mL

## 2023-06-30 LAB — HEMOGLOBIN A1C
Est. average glucose Bld gHb Est-mCnc: 171 mg/dL
Hgb A1c MFr Bld: 7.6 % — ABNORMAL HIGH (ref 4.8–5.6)

## 2023-06-30 MED ORDER — TRULICITY 3 MG/0.5ML ~~LOC~~ SOAJ: 3.0000 mg | SUBCUTANEOUS | 1 refills | Status: DC

## 2023-06-30 NOTE — Progress Notes (Signed)
Subjective:  Patient ID: Tristan Mccarty, male    DOB: 1956-02-17  Age: 68 y.o. MRN: [id number]  CC:   Chief Complaint  Patient presents with   Diabetes   Hypertension    HPI:  68 year old male presents for follow-up.  Average blood sugar readings elevated based on his personal libre.  He is compliant with Trulicity 1.5 mg, glipizide, and metformin.  Needs labs today including A1c.  Hypertension stable on losartan and amlodipine.  Patient states that he is feeling well.  No chest pain or shortness of breath.  He has no complaints or concerns at this time.  Lipids well-controlled on simvastatin.  Tolerating.  Patient Active Problem List   Diagnosis Date Noted   GERD (gastroesophageal reflux disease) 07/06/2021   BPH (benign prostatic hyperplasia) 07/06/2021   Hyperlipidemia 05/13/2013   Essential hypertension, benign 05/13/2013   Type 2 diabetes mellitus without complications (HCC) 05/13/2013    Social Hx   Social History   Socioeconomic History   Marital status: Married    Spouse name: Not on file   Number of children: Not on file   Years of education: Not on file   Highest education level: Not on file  Occupational History   Not on file  Tobacco Use   Smoking status: Never   Smokeless tobacco: Never  Vaping Use   Vaping status: Never Used  Substance and Sexual Activity   Alcohol use: Not Currently   Drug use: Not Currently   Sexual activity: Yes  Other Topics Concern   Not on file  Social History Narrative   Not on file   Social Drivers of Health   Financial Resource Strain: Not on file  Food Insecurity: Not on file  Transportation Needs: Not on file  Physical Activity: Not on file  Stress: Not on file  Social Connections: Not on file    Review of Systems Per HPI  Objective:  BP 135/72   Pulse 67   Temp 97.7 F (36.5 C)   Ht 5' 6.5" (1.689 m)   Wt 193 lb (87.5 kg)   SpO2 98%   BMI 30.68 kg/m      06/29/2023    3:05 PM 06/29/2023     2:51 PM 03/29/2023    4:04 PM  BP/Weight  Systolic BP 135 149 130  Diastolic BP 72 84 60  Wt. (Lbs)  193   BMI  30.68 kg/m2     Physical Exam Vitals and nursing note reviewed.  Constitutional:      General: He is not in acute distress.    Appearance: Normal appearance.  HENT:     Head: Normocephalic and atraumatic.  Eyes:     General:        Right eye: No discharge.        Left eye: No discharge.     Conjunctiva/sclera: Conjunctivae normal.  Cardiovascular:     Rate and Rhythm: Normal rate and regular rhythm.  Pulmonary:     Effort: Pulmonary effort is normal.     Breath sounds: Normal breath sounds. No wheezing, rhonchi or rales.  Neurological:     Mental Status: He is alert.  Psychiatric:        Mood and Affect: Mood normal.        Behavior: Behavior normal.     Lab Results  Component Value Date   WBC 6.4 08/31/2022   HGB 14.2 08/31/2022   HCT 42.6 08/31/2022   PLT 174 08/31/2022  GLUCOSE 89 06/29/2023   CHOL 135 08/31/2022   TRIG 99 08/31/2022   HDL 46 08/31/2022   LDLCALC 71 08/31/2022   ALT 18 08/31/2022   AST 16 08/31/2022   NA 141 06/29/2023   K 4.3 06/29/2023   CL 101 06/29/2023   CREATININE 0.84 06/29/2023   BUN 10 06/29/2023   CO2 24 06/29/2023   PSA 1.54 02/11/2014   HGBA1C 7.6 (H) 06/29/2023   MICROALBUR 0.5 06/29/2014     Assessment & Plan:   Problem List Items Addressed This Visit       Cardiovascular and Mediastinum   Essential hypertension, benign   Stable.  Continue her medications.        Endocrine   Type 2 diabetes mellitus without complications (HCC) - Primary   A1c returned at 7.6.  Increasing Trulicity.      Relevant Medications   Dulaglutide (TRULICITY) 3 MG/0.5ML SOAJ   Other Relevant Orders   Basic metabolic panel (Completed)   Hemoglobin A1c (Completed)   Microalbumin / creatinine urine ratio (Completed)     Other   Hyperlipidemia   Stable.  Continue statin.       Meds ordered this encounter   Medications   pantoprazole (PROTONIX) 40 MG tablet    Sig: Take 1 tablet (40 mg total) by mouth daily.    Dispense:  90 tablet    Refill:  1   Dulaglutide (TRULICITY) 3 MG/0.5ML SOAJ    Sig: Inject 3 mg as directed once a week.    Dispense:  6 mL    Refill:  1    Follow-up:  Return in about 3 months (around 09/26/2023).  Everlene Other DO [facility]

## 2023-06-30 NOTE — Assessment & Plan Note (Signed)
Stable.  Continue her medications.

## 2023-06-30 NOTE — Assessment & Plan Note (Signed)
Stable.  Continue statin.

## 2023-06-30 NOTE — Assessment & Plan Note (Signed)
A1c returned at 7.6.  Increasing Trulicity.

## 2023-07-03 ENCOUNTER — Encounter: Payer: Self-pay | Admitting: Family Medicine

## 2023-09-07 ENCOUNTER — Other Ambulatory Visit: Payer: Self-pay | Admitting: Nurse Practitioner

## 2023-09-07 DIAGNOSIS — M792 Neuralgia and neuritis, unspecified: Secondary | ICD-10-CM

## 2023-09-08 ENCOUNTER — Other Ambulatory Visit: Payer: Self-pay

## 2023-09-08 DIAGNOSIS — M792 Neuralgia and neuritis, unspecified: Secondary | ICD-10-CM

## 2023-09-08 MED ORDER — GABAPENTIN 300 MG PO CAPS: ORAL_CAPSULE | ORAL | 3 refills | Status: AC

## 2023-09-18 IMAGING — MR MR PROSTATE WO/W CM
47 of 56 series · 47 of 56 positions shown · IV contrast (y)
Comparison: None.

CLINICAL DATA: Prostate cancer suspected. Prior negative biopsy.
Elevated PSA.

EXAM:
MR PROSTATE WITHOUT AND WITH CONTRAST
TECHNIQUE: Multiplanar multisequence MRI images were obtained of the pelvis
centered about the prostate. Pre and post contrast images were
obtained.
CONTRAST:  8mL GADAVIST GADOBUTROL 1 MMOL/ML IV SOLN

[Series 2: T1 dynamic · axial · 5.0mm · 0.70mm/px · 1 of 128 slices shown (1 of 28)]
[im 1/128]
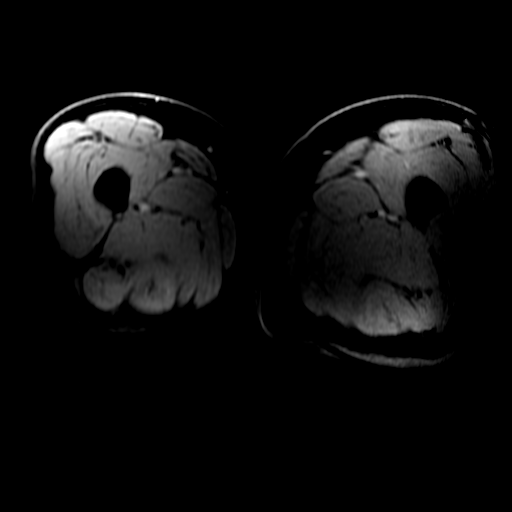

[Series 3: T2 · axial · 3.0mm · 0.31mm/px · 1 of 35 slices shown]
[im 1/35]
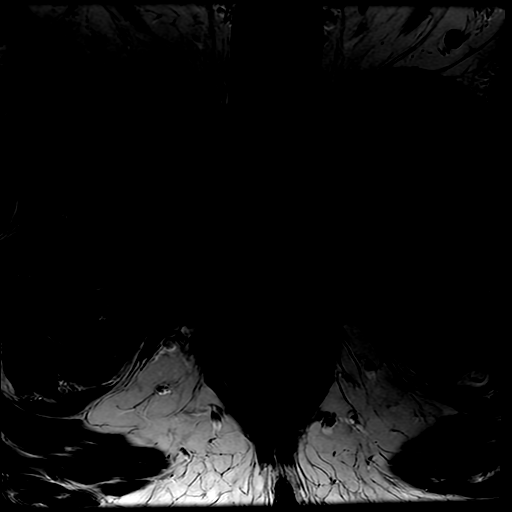

[Series 4: T2 fat-sat · sagittal · 4.0mm · 0.62mm/px · 1 of 19 slices shown]
[im 1/19]
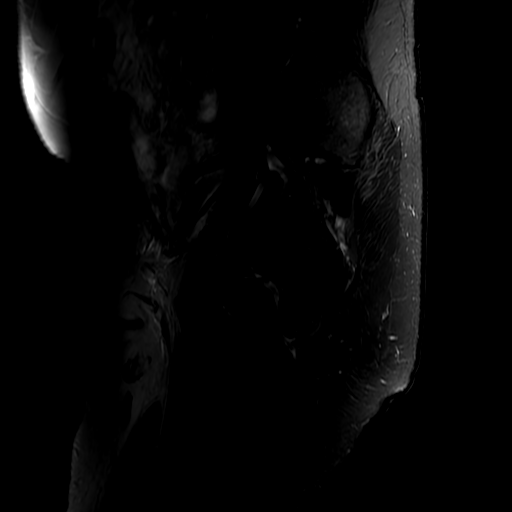

[Series 6: DWI · axial · 3.0mm · 0.78mm/px · 1 of 105 slices shown (1 of 2)]
[im 1/105]
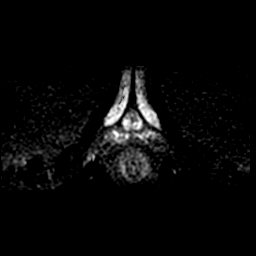

[Series 201: T1 dynamic · axial · 5.0mm · 0.70mm/px · 1 of 128 slices shown (2 of 28)]
[im 1/128]
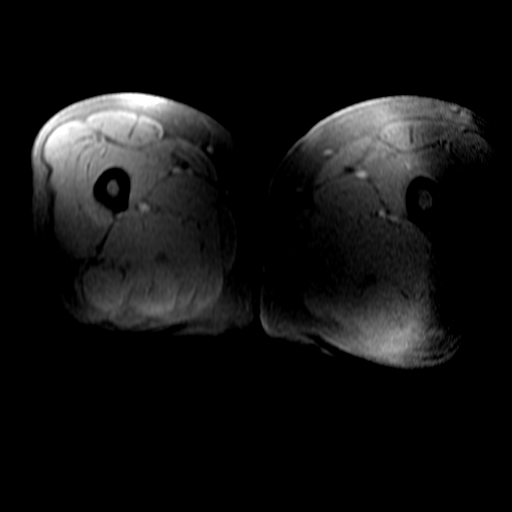

[Series 202: T1 dynamic · axial · 5.0mm · 0.70mm/px · 1 of 128 slices shown (3 of 28)]
[im 1/128]
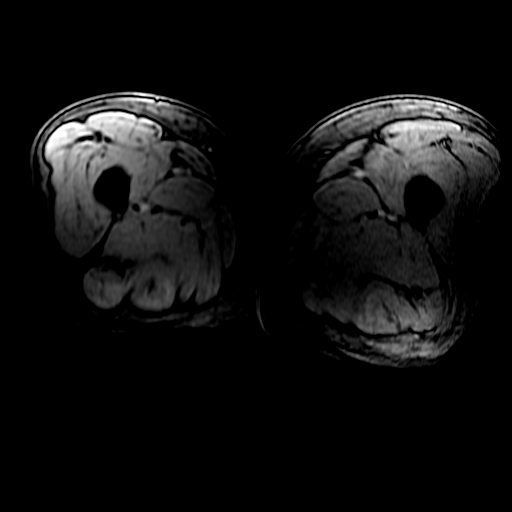

[Series 500: multiplanar reconstruction (mpr) · sagittal · 0.8mm · 0.39mm/px · 1 of 254 slices shown (1 of 2)]
[im 1/254]
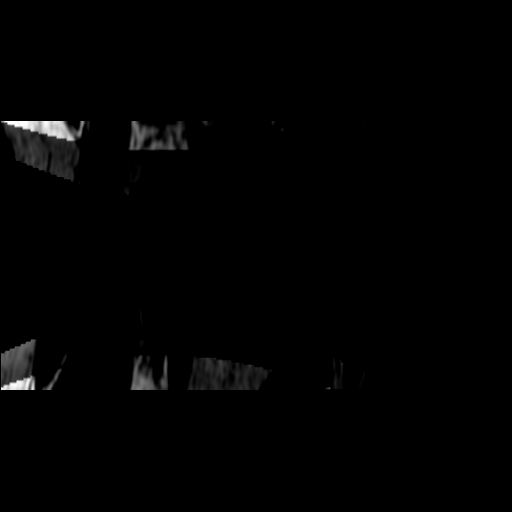

[Series 501: multiplanar reconstruction (mpr) · coronal · 0.8mm · 0.39mm/px · 1 of 255 slices shown (2 of 2)]
[im 1/255]
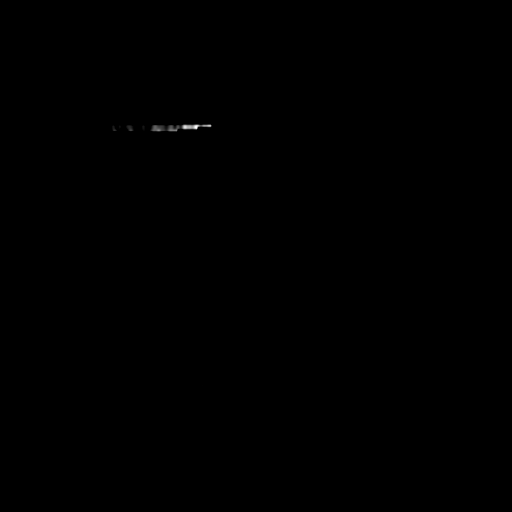

[Series 600: DWI · axial · 3.0mm · 0.78mm/px · 1 of 35 slices shown (2 of 2)]
[im 1/35]
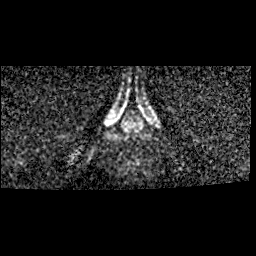

[Series 650: ADC · axial · 3.0mm · 0.78mm/px · 1 of 35 slices shown]
[im 1/35]
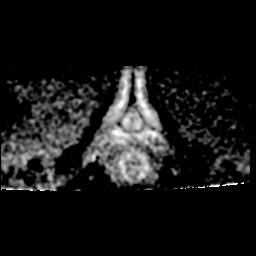

[Series 800: T1 dynamic · axial · 3.0mm · 0.78mm/px · 1 of 48 slices shown (4 of 28)]
[im 1/48]
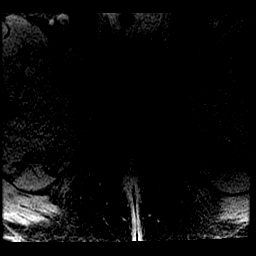

[Series 801: T1 dynamic · axial · 3.0mm · 0.78mm/px · 1 of 48 slices shown (5 of 28)]
[im 1/48]
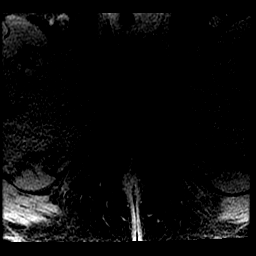

[Series 802: T1 dynamic · axial · 3.0mm · 0.78mm/px · 1 of 48 slices shown (6 of 28)]
[im 1/48]
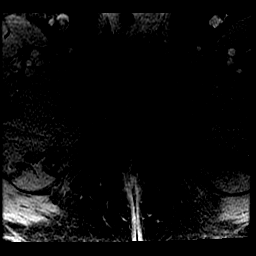

[Series 803: T1 dynamic · axial · 3.0mm · 0.78mm/px · 1 of 48 slices shown (7 of 28)]
[im 1/48]
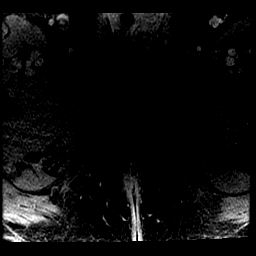

[Series 804: T1 dynamic · axial · 3.0mm · 0.78mm/px · 1 of 48 slices shown (8 of 28)]
[im 1/48]
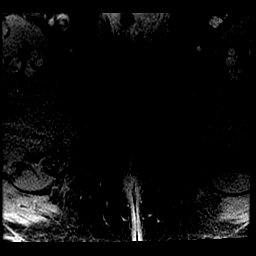

[Series 805: T1 dynamic · axial · 3.0mm · 0.78mm/px · 1 of 48 slices shown (9 of 28)]
[im 1/48]
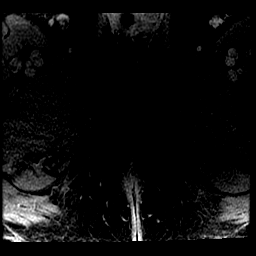

[Series 806: T1 dynamic · axial · 3.0mm · 0.78mm/px · 1 of 48 slices shown (10 of 28)]
[im 1/48]
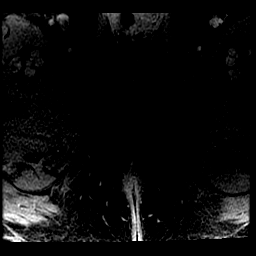

[Series 807: T1 dynamic · axial · 3.0mm · 0.78mm/px · 1 of 48 slices shown (11 of 28)]
[im 1/48]
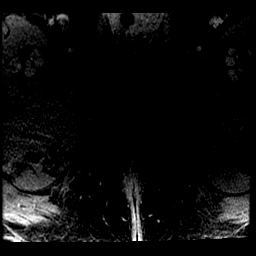

[Series 808: T1 dynamic · axial · 3.0mm · 0.78mm/px · 1 of 48 slices shown (12 of 28)]
[im 1/48]
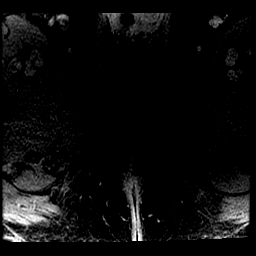

[Series 809: T1 dynamic · axial · 3.0mm · 0.78mm/px · 1 of 48 slices shown (13 of 28)]
[im 1/48]
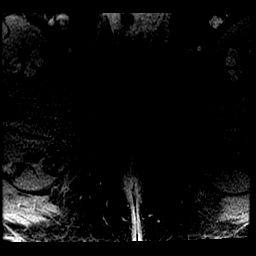

[Series 810: T1 dynamic · axial · 3.0mm · 0.78mm/px · 1 of 48 slices shown (14 of 28)]
[im 1/48]
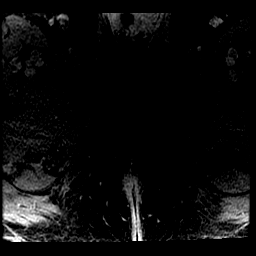

[Series 811: T1 dynamic · axial · 3.0mm · 0.78mm/px · 1 of 48 slices shown (15 of 28)]
[im 1/48]
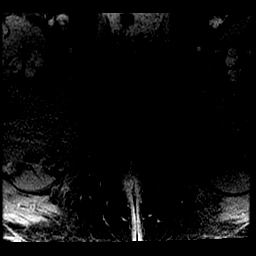

[Series 812: T1 dynamic · axial · 3.0mm · 0.78mm/px · 1 of 48 slices shown (16 of 28)]
[im 1/48]
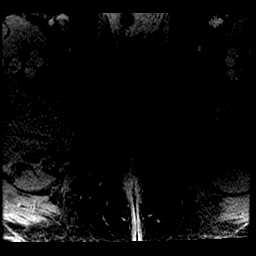

[Series 813: T1 dynamic · axial · 3.0mm · 0.78mm/px · 1 of 48 slices shown (17 of 28)]
[im 1/48]
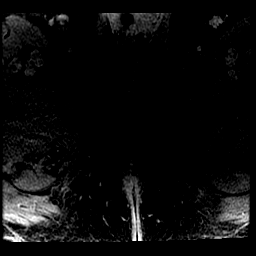

[Series 814: T1 dynamic · axial · 3.0mm · 0.78mm/px · 1 of 48 slices shown (18 of 28)]
[im 1/48]
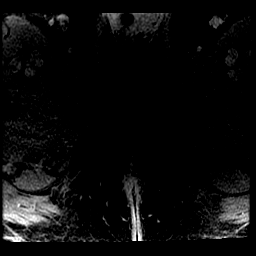

[Series 815: T1 dynamic · axial · 3.0mm · 0.78mm/px · 1 of 48 slices shown (19 of 28)]
[im 1/48]
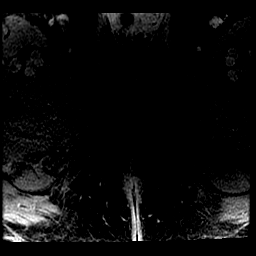

[Series 816: T1 dynamic · axial · 3.0mm · 0.78mm/px · 1 of 48 slices shown (20 of 28)]
[im 1/48]
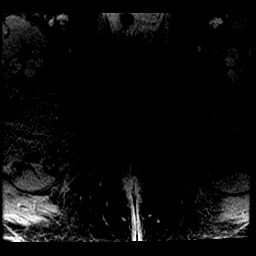

[Series 817: T1 dynamic · axial · 3.0mm · 0.78mm/px · 1 of 48 slices shown (21 of 28)]
[im 1/48]
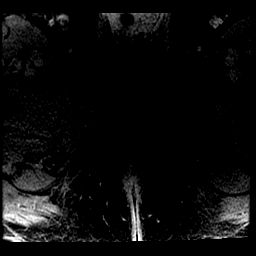

[Series 818: T1 dynamic · axial · 3.0mm · 0.78mm/px · 1 of 48 slices shown (22 of 28)]
[im 1/48]
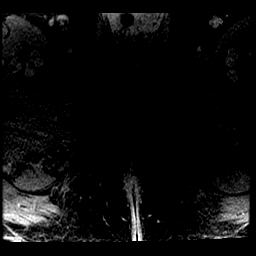

[Series 819: T1 dynamic · axial · 3.0mm · 0.78mm/px · 1 of 48 slices shown (23 of 28)]
[im 1/48]
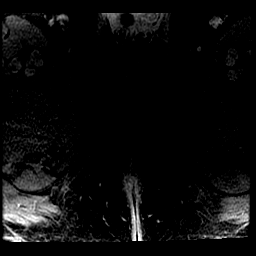

[Series 820: T1 dynamic · axial · 3.0mm · 0.78mm/px · 1 of 48 slices shown (24 of 28)]
[im 1/48]
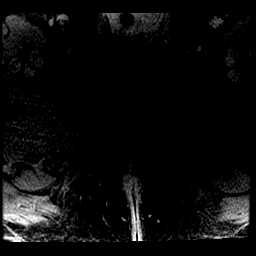

[Series 821: T1 dynamic · axial · 3.0mm · 0.78mm/px · 1 of 48 slices shown (25 of 28)]
[im 1/48]
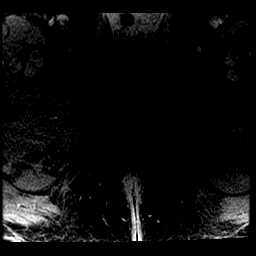

[Series 822: T1 dynamic · axial · 3.0mm · 0.78mm/px · 1 of 48 slices shown (26 of 28)]
[im 1/48]
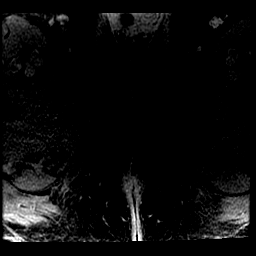

[Series 823: T1 dynamic · axial · 3.0mm · 0.78mm/px · 1 of 48 slices shown (27 of 28)]
[im 1/48]
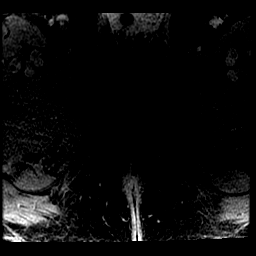

[Series 824: T1 dynamic · axial · 3.0mm · 0.78mm/px · 1 of 48 slices shown (28 of 28)]
[im 1/48]
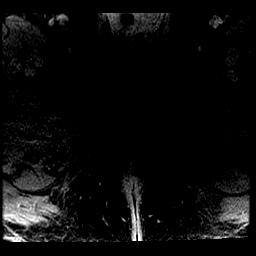

[((date))-((date)) · axial · 3.0mm · 0.78mm/px · 1 of 36 slices shown (1 of 12)]
[im 1/36]
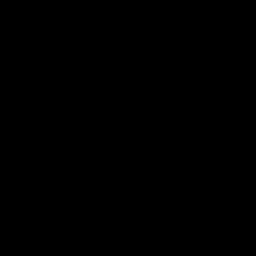

[((date))-((date)) · axial · 3.0mm · 0.78mm/px · 1 of 44 slices shown (2 of 12)]
[im 1/44]
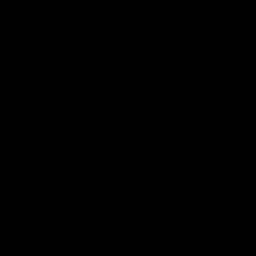

[((date))-((date)) · axial · 3.0mm · 0.78mm/px · 1 of 45 slices shown (3 of 12)]
[im 1/45]
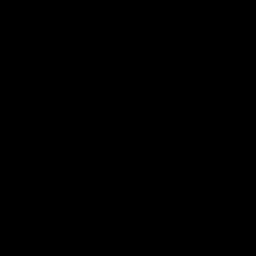

[((date))-((date)) · axial · 3.0mm · 0.78mm/px · 1 of 45 slices shown (4 of 12)]
[im 1/45]
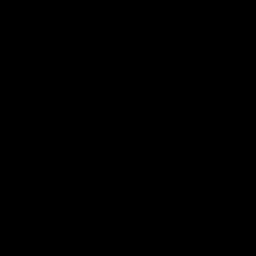

[((date))-((date)) · axial · 3.0mm · 0.78mm/px · 1 of 48 slices shown (5 of 12)]
[im 1/48]
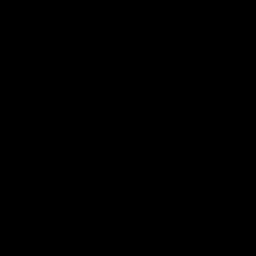

[((date))-((date)) · axial · 3.0mm · 0.78mm/px · 1 of 46 slices shown (6 of 12)]
[im 1/46]
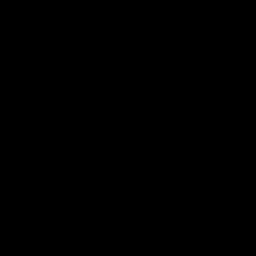

[((date))-((date)) · axial · 3.0mm · 0.78mm/px · 1 of 46 slices shown (7 of 12)]
[im 1/46]
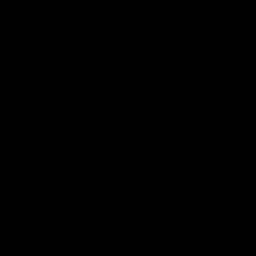

[((date))-((date)) · axial · 3.0mm · 0.78mm/px · 1 of 46 slices shown (8 of 12)]
[im 1/46]
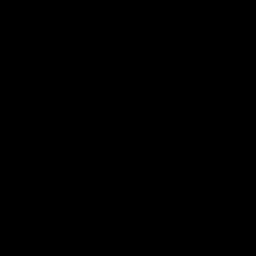

[((date))-((date)) · axial · 3.0mm · 0.78mm/px · 1 of 46 slices shown (9 of 12)]
[im 1/46]
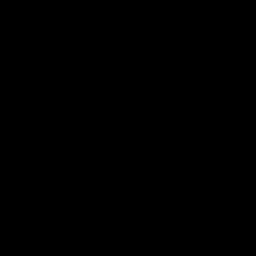

[((date))-((date)) · axial · 3.0mm · 0.78mm/px · 1 of 47 slices shown (10 of 12)]
[im 1/47]
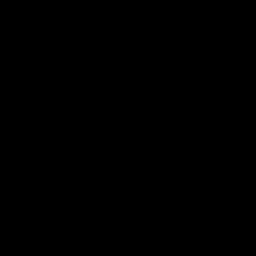

[((date))-((date)) · axial · 3.0mm · 0.78mm/px · 1 of 46 slices shown (11 of 12)]
[im 1/46]
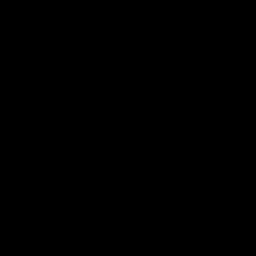

[((date))-((date)) · axial · 3.0mm · 0.78mm/px · 1 of 47 slices shown (12 of 12)]
[im 1/47]
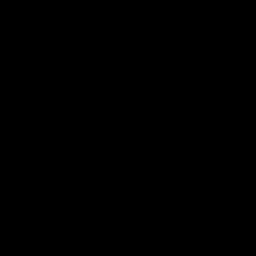

[47 of 56 positions shown; findings below may reference images not displayed]

FINDINGS: Prostate:

Peripheral zone: Normal high signal intensity within peripheral zone
on T2 weighted imaging (series 3). No foci of restricted diffusion
within the peripheral zone. No abnormal enhancement

Transitional zone:

The transitional zone is enlarged. There are multiple well
encapsulated nodules within the transitional zone which are
heterogeneous on T2 weighted signal typical of hyperplastic nodules.

Volume: 4.5 x 4.8 x 3.9 cm (volume = 44 cm^3)

Transcapsular spread:  Absent

Seminal vesicle involvement: Absent

Neurovascular bundle involvement: Absent

Pelvic adenopathy: Absent

Bone metastasis: Absent

Other findings: None
IMPRESSION: 1. No high-grade carcinoma identified in the peripheral zone.
PI-RADS: 1.
2. Enlarged nodular transitional zone most consistent with benign
prostate hypertrophy. PI-RADS: 2

## 2023-09-27 ENCOUNTER — Telehealth (INDEPENDENT_AMBULATORY_CARE_PROVIDER_SITE_OTHER): Payer: BC Managed Care – PPO | Admitting: Family Medicine

## 2023-09-27 DIAGNOSIS — I10 Essential (primary) hypertension: Secondary | ICD-10-CM

## 2023-09-27 DIAGNOSIS — E119 Type 2 diabetes mellitus without complications: Secondary | ICD-10-CM

## 2023-09-27 DIAGNOSIS — E785 Hyperlipidemia, unspecified: Secondary | ICD-10-CM | POA: Diagnosis not present

## 2023-09-27 DIAGNOSIS — E782 Mixed hyperlipidemia: Secondary | ICD-10-CM

## 2023-09-27 DIAGNOSIS — Z7985 Long-term (current) use of injectable non-insulin antidiabetic drugs: Secondary | ICD-10-CM | POA: Diagnosis not present

## 2023-09-27 DIAGNOSIS — Z7984 Long term (current) use of oral hypoglycemic drugs: Secondary | ICD-10-CM

## 2023-09-28 MED ORDER — TAMSULOSIN HCL 0.4 MG PO CAPS: 0.4000 mg | ORAL_CAPSULE | Freq: Every day | ORAL | 3 refills | Status: AC

## 2023-09-28 MED ORDER — METFORMIN HCL 1000 MG PO TABS: 1000.0000 mg | ORAL_TABLET | Freq: Two times a day (BID) | ORAL | 3 refills | Status: AC

## 2023-09-28 MED ORDER — LOSARTAN POTASSIUM 100 MG PO TABS: 100.0000 mg | ORAL_TABLET | Freq: Every day | ORAL | 3 refills | Status: AC

## 2023-09-28 MED ORDER — SIMVASTATIN 40 MG PO TABS: 40.0000 mg | ORAL_TABLET | Freq: Every day | ORAL | 3 refills | Status: AC

## 2023-09-28 MED ORDER — TRULICITY 3 MG/0.5ML ~~LOC~~ SOAJ
3.0000 mg | SUBCUTANEOUS | 1 refills | Status: DC
Start: 2023-09-28 — End: 2023-10-11

## 2023-09-28 MED ORDER — GLIPIZIDE 5 MG PO TABS: ORAL_TABLET | ORAL | 3 refills | Status: AC

## 2023-09-28 NOTE — Progress Notes (Signed)
 Virtual Visit via Video Note  I connected with Tristan Mccarty on 09/28/23 at  2:50 PM EDT by a video enabled telemedicine application and verified that I am speaking with the correct person using two identifiers.  Location: Patient: Home Provider: Home   I discussed the limitations of evaluation and management by telemedicine and the availability of in person appointments. The patient expressed understanding and agreed to proceed.  History of Present Illness:  68 year old male with hypertension, GERD, type 2 diabetes, BPH, hyperlipidemia presents for follow-up.  Patient states overall he is doing well.  Compliant with medication.  Pressure has been stable.  Patient needs follow-up labs to reassess A1c and lipids.  Patient reports that he is not having any difficulty with Trulicity .   Observations/Objective: General: No acute distress. Respiratory: Speaking in full sentences.  No distress.  Assessment and Plan:  Hypertension Stable on amlodipine  and losartan .  Type 2 diabetes Last A1c 7.6.  Patient optimistic that his A1c has improved.  Continue Trulicity , glipizide , and metformin .  A1c ordered.  Hyperlipidemia Compliant with simvastatin .  Continue.  Last LDL 71.  Lipid panel ordered.  Orders Placed This Encounter  Procedures   Hemoglobin A1c   Lipid panel   Meds ordered this encounter  Medications   losartan  (COZAAR ) 100 MG tablet    Sig: Take 1 tablet (100 mg total) by mouth daily.    Dispense:  90 tablet    Refill:  3   metFORMIN  (GLUCOPHAGE ) 1000 MG tablet    Sig: Take 1 tablet (1,000 mg total) by mouth 2 (two) times daily.    Dispense:  180 tablet    Refill:  3   simvastatin  (ZOCOR ) 40 MG tablet    Sig: Take 1 tablet (40 mg total) by mouth daily.    Dispense:  90 tablet    Refill:  3   tamsulosin  (FLOMAX ) 0.4 MG CAPS capsule    Sig: Take 1 capsule (0.4 mg total) by mouth daily.    Dispense:  90 capsule    Refill:  3   glipiZIDE  (GLUCOTROL ) 5 MG tablet     Sig: TAKE 2 TABLETS TWICE A DAY BEFORE MEALS    Dispense:  360 tablet    Refill:  3   Dulaglutide  (TRULICITY ) 3 MG/0.5ML SOAJ    Sig: Inject 3 mg as directed once a week.    Dispense:  6 mL    Refill:  1    Follow Up Instructions:    I discussed the assessment and treatment plan with the patient. The patient was provided an opportunity to ask questions and all were answered. The patient agreed with the plan and demonstrated an understanding of the instructions.   The patient was advised to call back or seek an in-person evaluation if the symptoms worsen or if the condition fails to improve as anticipated.  I provided 10 minutes of non-face-to-face time during this encounter.   Talani Brazee G Trayvion Embleton, DO

## 2023-10-06 DIAGNOSIS — E119 Type 2 diabetes mellitus without complications: Secondary | ICD-10-CM | POA: Diagnosis not present

## 2023-10-06 DIAGNOSIS — E782 Mixed hyperlipidemia: Secondary | ICD-10-CM | POA: Diagnosis not present

## 2023-10-07 LAB — LIPID PANEL
Chol/HDL Ratio: 2.6 ratio (ref 0.0–5.0)
Cholesterol, Total: 115 mg/dL (ref 100–199)
HDL: 44 mg/dL (ref >39)
LDL Chol Calc (NIH): 42 mg/dL (ref 0–99)
Triglycerides: 177 mg/dL — ABNORMAL HIGH (ref 0–149)
VLDL Cholesterol Cal: 29 mg/dL (ref 5–40)

## 2023-10-07 LAB — HEMOGLOBIN A1C
Est. average glucose Bld gHb Est-mCnc: 160 mg/dL
Hgb A1c MFr Bld: 7.2 % — ABNORMAL HIGH (ref 4.8–5.6)

## 2023-10-10 ENCOUNTER — Ambulatory Visit: Payer: Self-pay | Admitting: Family Medicine

## 2023-10-11 ENCOUNTER — Other Ambulatory Visit: Payer: Self-pay | Admitting: Family Medicine

## 2023-10-11 MED ORDER — TRULICITY 4.5 MG/0.5ML ~~LOC~~ SOAJ: 4.5000 mg | SUBCUTANEOUS | 3 refills | Status: AC

## 2023-11-21 ENCOUNTER — Other Ambulatory Visit: Payer: Self-pay | Admitting: Family Medicine

## 2023-12-14 ENCOUNTER — Other Ambulatory Visit: Payer: Self-pay | Admitting: Family Medicine

## 2023-12-28 ENCOUNTER — Ambulatory Visit: Admitting: Family Medicine

## 2023-12-28 VITALS — BP 130/72 | HR 78 | Temp 97.1°F | Ht 66.5 in | Wt 183.0 lb

## 2023-12-28 DIAGNOSIS — Z0000 Encounter for general adult medical examination without abnormal findings: Secondary | ICD-10-CM

## 2023-12-28 DIAGNOSIS — Z7984 Long term (current) use of oral hypoglycemic drugs: Secondary | ICD-10-CM

## 2023-12-28 DIAGNOSIS — E119 Type 2 diabetes mellitus without complications: Secondary | ICD-10-CM | POA: Diagnosis not present

## 2023-12-28 DIAGNOSIS — Z23 Encounter for immunization: Secondary | ICD-10-CM | POA: Diagnosis not present

## 2023-12-28 NOTE — Patient Instructions (Signed)
 A1c after 8/22.  Follow up in 6 months.  Take care  Dr. Bluford

## 2023-12-29 DIAGNOSIS — Z0000 Encounter for general adult medical examination without abnormal findings: Secondary | ICD-10-CM | POA: Insufficient documentation

## 2023-12-29 NOTE — Assessment & Plan Note (Signed)
 Discussed health maintenance.  Pneumococcal vaccine given today.  Flu vaccine when available.  A1c ordered.  Continue current medications.

## 2023-12-29 NOTE — Progress Notes (Signed)
 Subjective:  Patient ID: Tristan Mccarty, male    DOB: 08-20-1955  Age: 68 y.o. MRN: [id number]  CC:   Chief Complaint  Patient presents with   Annual Exam    HPI:  68 year old male with hypertension, GERD, type 2 diabetes, BPH, hyperlipidemia presents for an annual exam.  Patient overall doing well.  Needs A1c after 8/22.  Last A1c was mildly elevated at 7.2.  BP is well-controlled.  He is feeling well.  No chest pain or shortness of breath.  Needs foot exam today.  He is amenable to pneumococcal vaccine.  We do not yet have influenza vaccines.  Patient Active Problem List   Diagnosis Date Noted   Annual physical exam 12/29/2023   GERD (gastroesophageal reflux disease) 07/06/2021   BPH (benign prostatic hyperplasia) 07/06/2021   Hyperlipidemia 05/13/2013   Essential hypertension, benign 05/13/2013   Type 2 diabetes mellitus without complications (HCC) 05/13/2013    Social Hx   Social History   Socioeconomic History   Marital status: Married    Spouse name: Not on file   Number of children: Not on file   Years of education: Not on file   Highest education level: Not on file  Occupational History   Not on file  Tobacco Use   Smoking status: Never   Smokeless tobacco: Never  Vaping Use   Vaping status: Never Used  Substance and Sexual Activity   Alcohol use: Not Currently   Drug use: Not Currently   Sexual activity: Yes  Other Topics Concern   Not on file  Social History Narrative   Not on file   Social Drivers of Health   Financial Resource Strain: Not on file  Food Insecurity: Not on file  Transportation Needs: Not on file  Physical Activity: Not on file  Stress: Not on file  Social Connections: Not on file    Review of Systems Per HPI  Objective:  BP 130/72   Pulse 78   Temp (!) 97.1 F (36.2 C)   Ht 5' 6.5 (1.689 m)   Wt 183 lb (83 kg)   SpO2 97%   BMI 29.09 kg/m      12/28/2023    2:32 PM 06/29/2023    3:05 PM 06/29/2023    2:51 PM   BP/Weight  Systolic BP 130 135 149  Diastolic BP 72 72 84  Wt. (Lbs) 183  193  BMI 29.09 kg/m2  30.68 kg/m2    Physical Exam Vitals and nursing note reviewed.  Constitutional:      General: He is not in acute distress.    Appearance: Normal appearance.  HENT:     Head: Normocephalic and atraumatic.  Eyes:     General:        Right eye: No discharge.        Left eye: No discharge.     Conjunctiva/sclera: Conjunctivae normal.  Cardiovascular:     Rate and Rhythm: Normal rate and regular rhythm.  Pulmonary:     Effort: Pulmonary effort is normal.     Breath sounds: Normal breath sounds. No wheezing, rhonchi or rales.  Feet:     Comments: Diabetic foot exam performed today.  See quality metrics section. Neurological:     Mental Status: He is alert.  Psychiatric:        Mood and Affect: Mood normal.        Behavior: Behavior normal.     Lab Results  Component Value Date   WBC  6.4 08/31/2022   HGB 14.2 08/31/2022   HCT 42.6 08/31/2022   PLT 174 08/31/2022   GLUCOSE 89 06/29/2023   CHOL 115 10/06/2023   TRIG 177 (H) 10/06/2023   HDL 44 10/06/2023   LDLCALC 42 10/06/2023   ALT 18 08/31/2022   AST 16 08/31/2022   NA 141 06/29/2023   K 4.3 06/29/2023   CL 101 06/29/2023   CREATININE 0.84 06/29/2023   BUN 10 06/29/2023   CO2 24 06/29/2023   PSA 1.54 02/11/2014   HGBA1C 7.2 (H) 10/06/2023   MICROALBUR 0.5 06/29/2014     Assessment & Plan:  Annual physical exam Assessment & Plan: Discussed health maintenance.  Pneumococcal vaccine given today.  Flu vaccine when available.  A1c ordered.  Continue current medications.   Type 2 diabetes mellitus without complication, without long-term current use of insulin (HCC) -     Hemoglobin A1c  Immunization due -     Pneumococcal conjugate vaccine 20-valent    Follow-up: 6 months  Charley Lafrance Bluford DO [facility]

## 2024-01-09 DIAGNOSIS — E119 Type 2 diabetes mellitus without complications: Secondary | ICD-10-CM | POA: Diagnosis not present

## 2024-01-10 ENCOUNTER — Ambulatory Visit: Payer: Self-pay | Admitting: Family Medicine

## 2024-01-10 LAB — HEMOGLOBIN A1C
Est. average glucose Bld gHb Est-mCnc: 143 mg/dL
Hgb A1c MFr Bld: 6.6 % — ABNORMAL HIGH (ref 4.8–5.6)

## 2024-01-17 ENCOUNTER — Other Ambulatory Visit: Payer: Self-pay | Admitting: Family Medicine

## 2024-01-17 MED ORDER — FREESTYLE LIBRE 3 SENSOR MISC: 3 refills | Status: AC

## 2024-01-17 MED ORDER — FREESTYLE LIBRE 3 PLUS SENSOR MISC: 3 refills | Status: AC

## 2024-02-07 ENCOUNTER — Telehealth: Payer: Self-pay | Admitting: Family Medicine

## 2024-02-07 NOTE — Telephone Encounter (Signed)
 Patient is needing a prescription for true Metrix self - monitoring blood glucose meter and test strips. He states insurance no longer pays for ol one. Send to [employer]. Copy of letter in your folder

## 2024-02-08 ENCOUNTER — Other Ambulatory Visit: Payer: Self-pay

## 2024-02-08 ENCOUNTER — Other Ambulatory Visit: Payer: Self-pay | Admitting: Family Medicine

## 2024-02-08 DIAGNOSIS — E119 Type 2 diabetes mellitus without complications: Secondary | ICD-10-CM

## 2024-02-08 MED ORDER — FREESTYLE LITE DEVI: 0 refills | Status: DC

## 2024-02-08 MED ORDER — FREESTYLE LITE TEST VI STRP: ORAL_STRIP | 12 refills | Status: DC

## 2024-02-08 MED ORDER — BLOOD GLUCOSE MONITORING SUPPL DEVI: 1.0000 | Freq: Three times a day (TID) | 1 refills | Status: AC

## 2024-02-08 MED ORDER — BLOOD GLUCOSE TEST VI STRP: 1.0000 | ORAL_STRIP | Freq: Three times a day (TID) | 0 refills | Status: AC

## 2024-02-23 ENCOUNTER — Other Ambulatory Visit: Payer: Self-pay | Admitting: Family Medicine

## 2024-02-23 DIAGNOSIS — I10 Essential (primary) hypertension: Secondary | ICD-10-CM

## 2024-05-31 ENCOUNTER — Ambulatory Visit
Admission: EM | Admit: 2024-05-31 | Discharge: 2024-05-31 | Disposition: A | Discharge status: Home / Self Care | Attending: Family Medicine | Admitting: Family Medicine

## 2024-05-31 DIAGNOSIS — L239 Allergic contact dermatitis, unspecified cause: Secondary | ICD-10-CM | POA: Diagnosis not present

## 2024-05-31 MED ORDER — METHYLPREDNISOLONE ACETATE 40 MG/ML IJ SUSP
40.0000 mg | Freq: Once | INTRAMUSCULAR | Status: AC
  Administered 2024-05-31: 40 mg via INTRAMUSCULAR

## 2024-05-31 MED ORDER — TRIAMCINOLONE ACETONIDE 0.1 % EX CREA: 1.0000 | TOPICAL_CREAM | Freq: Two times a day (BID) | CUTANEOUS | 0 refills | Status: AC | PRN

## 2024-05-31 MED ORDER — CETIRIZINE HCL 10 MG PO TABS: 10.0000 mg | ORAL_TABLET | Freq: Every day | ORAL | 0 refills | Status: AC

## 2024-05-31 NOTE — Discharge Instructions (Signed)
 We have given you a steroid shot today and I have prescribed a steroid cream to use topically on these areas as well as an allergy pill to take daily.  Avoid any scented soaps, products, detergents and follow-up with your primary care provider for recheck if not resolving.  Monitor your blood sugars closely over the next week with the steroids.

## 2024-05-31 NOTE — ED Provider Notes (Signed)
 " [facility]    CSN: [id number] Arrival date & time: 05/31/24  1744      History   Chief Complaint No chief complaint on file.   HPI Tristan Mccarty is a 69 y.o. male.   Patient presenting today with 1 month of itching across most of body and now patchy red papular rashes sporadically including bilateral ankles, upper back.  Denies throat itching or swelling, chest tightness, shortness of breath, wheezing, abdominal pain, vomiting, diarrhea.  So far trying Neosporin, anti-itch gels with minimal relief.  No new foods or medications, no hygiene product changes or any other new exposures that he is aware of.    Past Medical History:  Diagnosis Date   Hyperlipidemia    Hypertension    Reflux    Type 2 diabetes mellitus (HCC)     Patient Active Problem List   Diagnosis Date Noted   Annual physical exam 12/29/2023   GERD (gastroesophageal reflux disease) 07/06/2021   BPH (benign prostatic hyperplasia) 07/06/2021   Hyperlipidemia 05/13/2013   Essential hypertension, benign 05/13/2013   Type 2 diabetes mellitus without complications (HCC) 05/13/2013    Past Surgical History:  Procedure Laterality Date   BIOPSY  08/03/2021   Procedure: BIOPSY;  Surgeon: Cindie Carlin POUR, DO;  [facility]: [facility];  Service: Endoscopy;;   COLONOSCOPY     COLONOSCOPY WITH PROPOFOL  N/A 04/03/2021   Procedure: COLONOSCOPY WITH PROPOFOL ;  Surgeon: Cindie Carlin POUR, DO;  Location: [facility];  Service: Endoscopy;  Laterality: N/A;  2:00 / ASA II   COLONOSCOPY WITH PROPOFOL  N/A 08/03/2021   Procedure: COLONOSCOPY WITH PROPOFOL ;  Surgeon: Cindie Carlin POUR, DO;  Location: [facility];  Service: Endoscopy;  Laterality: N/A;  9:15am, ASA 2   EYE SURGERY     POLYPECTOMY  04/03/2021   Procedure: POLYPECTOMY;  Surgeon: Cindie Carlin POUR, DO;  Location: [facility];  Service: Endoscopy;;  ascending   POLYPECTOMY  08/03/2021   Procedure: POLYPECTOMY;  Surgeon: Cindie Carlin POUR, DO;   Location: [facility];  Service: Endoscopy;;       Home Medications    Prior to Admission medications  Medication Sig Start Date End Date Taking? Authorizing Provider  cetirizine  (ZYRTEC  ALLERGY) 10 MG tablet Take 1 tablet (10 mg total) by mouth daily. 05/31/24  Yes Stuart Vernell Norris, PA-C  triamcinolone  cream (KENALOG ) 0.1 % Apply 1 Application topically 2 (two) times daily as needed. Avoid use on face or private areas 05/31/24  Yes Stuart Vernell Norris, PA-C  amLODipine  (NORVASC ) 5 MG tablet TAKE 1 TABLET TWICE A DAY 02/23/24   Cook, Jayce G, DO  Blood Glucose Monitoring Suppl DEVI 1 each by Does not apply route in the morning, at noon, and at bedtime. May substitute to any manufacturer covered by patient's insurance. 02/08/24   Cook, Jayce G, DO  Continuous Glucose Sensor (FREESTYLE LIBRE 3 PLUS SENSOR) MISC Use to check blood sugar continuously. Change sensor every 15 days. 01/17/24   Cook, Jayce G, DO  Continuous Glucose Sensor (FREESTYLE LIBRE 3 SENSOR) MISC Place 1 sensor on the skin every 14 days. Use to check glucose continuously 01/17/24   Cook, Jayce G, DO  Dulaglutide  (TRULICITY ) 4.5 MG/0.5ML SOAJ Inject 4.5 mg into the skin once a week. 10/11/23   Cook, Jayce G, DO  gabapentin  (NEURONTIN ) 300 MG capsule TAKE 1 TO 2 CAPSULES PER ADMINISTRATION INSTRUCTIONS (TAKE 1 CAPSULE IN THE MORNING AND 2 CAPSULES AT NIGHT) 09/08/23   Bluford,  Jayce G, DO  glipiZIDE  (GLUCOTROL ) 5 MG tablet TAKE 2 TABLETS TWICE A DAY BEFORE MEALS 09/28/23   Cook, Jayce G, DO  Glucose Blood (BLOOD GLUCOSE TEST STRIPS) STRP 1 each by Does not apply route in the morning, at noon, and at bedtime. May substitute to any manufacturer covered by patient's insurance. 02/08/24   Cook, Jayce G, DO  Ibuprofen-diphenhydrAMINE HCl (ADVIL PM) 200-25 MG CAPS Take 2 tablets by mouth at bedtime.    [provider]  losartan  (COZAAR ) 100 MG tablet Take 1 tablet (100 mg total) by mouth daily. 09/28/23   Cook, Jayce G, DO   metFORMIN  (GLUCOPHAGE ) 1000 MG tablet Take 1 tablet (1,000 mg total) by mouth 2 (two) times daily. 09/28/23   Cook, Jayce G, DO  pantoprazole  (PROTONIX ) 40 MG tablet TAKE 1 TABLET DAILY 12/14/23   Cook, Jayce G, DO  simvastatin  (ZOCOR ) 40 MG tablet Take 1 tablet (40 mg total) by mouth daily. 09/28/23   Cook, Jayce G, DO  tamsulosin  (FLOMAX ) 0.4 MG CAPS capsule Take 1 capsule (0.4 mg total) by mouth daily. 09/28/23   Cook, Jayce G, DO    Family History Family History  Problem Relation Age of Onset   Cancer Mother        Breast   Hypertension Mother    Heart attack Father    Colon cancer Neg Hx     Social History Social History[1]   Allergies   Adipex-p [phentermine hcl] and Vasotec [enalapril]   Review of Systems Review of Systems Per HPI  Physical Exam Triage Vital Signs ED Triage Vitals [05/31/24 1755]  Encounter Vitals Group     BP (!) 154/84     Girls Systolic BP Percentile      Girls Diastolic BP Percentile      Boys Systolic BP Percentile      Boys Diastolic BP Percentile      Pulse Rate 75     Resp 18     Temp 98.4 F (36.9 C)     Temp Source Oral     SpO2 96 %     Weight      Height      Head Circumference      Peak Flow      Pain Score      Pain Loc      Pain Education      Exclude from Growth Chart    No data found.  Updated Vital Signs BP (!) 154/84 (BP Location: Right Arm)   Pulse 75   Temp 98.4 F (36.9 C) (Oral)   Resp 18   SpO2 96%   Visual Acuity Right Eye Distance:   Left Eye Distance:   Bilateral Distance:    Right Eye Near:   Left Eye Near:    Bilateral Near:     Physical Exam Vitals and nursing note reviewed.  Constitutional:      Appearance: Normal appearance.  HENT:     Head: Atraumatic.  Eyes:     Extraocular Movements: Extraocular movements intact.     Conjunctiva/sclera: Conjunctivae normal.  Cardiovascular:     Rate and Rhythm: Normal rate.  Pulmonary:     Effort: Pulmonary effort is normal.     Breath sounds:  Normal breath sounds.  Musculoskeletal:        General: Normal range of motion.     Cervical back: Normal range of motion and neck supple.  Skin:    General: Skin is warm and dry.  Findings: Erythema present.     Comments: Erythematous maculopapular rashes to bilateral ankles, sporadic to extremities, upper back  Neurological:     Mental Status: He is oriented to person, place, and time.  Psychiatric:        Mood and Affect: Mood normal.        Thought Content: Thought content normal.        Judgment: Judgment normal.      UC Treatments / Results  Labs (all labs ordered are listed, but only abnormal results are displayed) Labs Reviewed - No data to display  EKG   Radiology No results found.  Procedures Procedures (including critical care time)  Medications Ordered in UC Medications  methylPREDNISolone  acetate (DEPO-MEDROL ) injection 40 mg (40 mg Intramuscular Given 05/31/24 1825)    Initial Impression / Assessment and Plan / UC Course  I have reviewed the triage vital signs and the nursing notes.  Pertinent labs & imaging results that were available during my care of the patient were reviewed by me and considered in my medical decision making (see chart for details).     Consistent with an allergic dermatitis, unclear etiology.  Treat with IM Depo-Medrol  with close monitoring of home blood sugars which he states are very well-controlled at this time, triamcinolone  cream, Zyrtec , supportive over-the-counter medications and home care.  Return for worsening or unresolving symptoms.  Final Clinical Impressions(s) / UC Diagnoses   Final diagnoses:  Allergic dermatitis     Discharge Instructions      We have given you a steroid shot today and I have prescribed a steroid cream to use topically on these areas as well as an allergy pill to take daily.  Avoid any scented soaps, products, detergents and follow-up with your primary care provider for recheck if not  resolving.  Monitor your blood sugars closely over the next week with the steroids.    ED Prescriptions     Medication Sig Dispense Auth. Provider   triamcinolone  cream (KENALOG ) 0.1 % Apply 1 Application topically 2 (two) times daily as needed. Avoid use on face or private areas 80 g Stuart Vernell Norris, PA-C   cetirizine  (ZYRTEC  ALLERGY) 10 MG tablet Take 1 tablet (10 mg total) by mouth daily. 20 tablet Stuart Vernell Norris, [place]      PDMP not reviewed this encounter.    [1]  Social History Tobacco Use   Smoking status: Never   Smokeless tobacco: Never  Vaping Use   Vaping status: Never Used  Substance Use Topics   Alcohol use: Not Currently   Drug use: Not Currently     Stuart Vernell Norris, PA-C 05/31/24 1831  "

## 2024-05-31 NOTE — ED Triage Notes (Signed)
 Pt reports he has been itching x 1 month and now has a rash on left lower leg     Tried neosporin

## 2024-07-02 ENCOUNTER — Ambulatory Visit: Admitting: Family Medicine
